# Patient Record
Sex: Female | Born: 1955 | Race: Black or African American | Hispanic: No | Marital: Married | Smoking: Current some day smoker
Health system: Southern US, Community
[De-identification: ages and names within clinical notes are randomized; demographics above are authoritative.]

## PROBLEM LIST (undated history)

## (undated) DIAGNOSIS — E7800 Pure hypercholesterolemia, unspecified: Secondary | ICD-10-CM

## (undated) DIAGNOSIS — L0390 Cellulitis, unspecified: Secondary | ICD-10-CM

## (undated) DIAGNOSIS — M5430 Sciatica, unspecified side: Secondary | ICD-10-CM

## (undated) DIAGNOSIS — I10 Essential (primary) hypertension: Secondary | ICD-10-CM

## (undated) HISTORY — PX: OTHER SURGICAL HISTORY: SHX169

---

## 2012-06-07 ENCOUNTER — Ambulatory Visit: Payer: Self-pay | Admitting: Gastroenterology

## 2012-06-11 LAB — PATHOLOGY REPORT

## 2013-06-30 ENCOUNTER — Ambulatory Visit: Payer: Self-pay | Admitting: Vascular Surgery

## 2013-06-30 LAB — BASIC METABOLIC PANEL
Calcium, Total: 9.5 mg/dL (ref 8.5–10.1)
Chloride: 109 mmol/L — ABNORMAL HIGH (ref 98–107)
Co2: 28 mmol/L (ref 21–32)
Osmolality: 280 (ref 275–301)
Potassium: 4.4 mmol/L (ref 3.5–5.1)
Sodium: 138 mmol/L (ref 136–145)

## 2013-07-29 ENCOUNTER — Ambulatory Visit: Payer: Self-pay | Admitting: Vascular Surgery

## 2013-07-29 DIAGNOSIS — I10 Essential (primary) hypertension: Secondary | ICD-10-CM

## 2013-07-29 LAB — BASIC METABOLIC PANEL
Anion Gap: 2 — ABNORMAL LOW (ref 7–16)
Calcium, Total: 10.3 mg/dL — ABNORMAL HIGH (ref 8.5–10.1)
Creatinine: 1.2 mg/dL (ref 0.60–1.30)
EGFR (Non-African Amer.): 50 — ABNORMAL LOW
Glucose: 94 mg/dL (ref 65–99)
Osmolality: 273 (ref 275–301)
Potassium: 4.2 mmol/L (ref 3.5–5.1)

## 2013-07-29 LAB — CBC
HCT: 39.1 % (ref 35.0–47.0)
MCH: 28.7 pg (ref 26.0–34.0)
MCHC: 32.9 g/dL (ref 32.0–36.0)
Platelet: 327 10*3/uL (ref 150–440)
RBC: 4.48 10*6/uL (ref 3.80–5.20)
RDW: 16.3 % — ABNORMAL HIGH (ref 11.5–14.5)
WBC: 8.1 10*3/uL (ref 3.6–11.0)

## 2013-08-06 ENCOUNTER — Ambulatory Visit: Payer: Self-pay | Admitting: Vascular Surgery

## 2013-08-08 LAB — PATHOLOGY REPORT

## 2013-08-17 ENCOUNTER — Inpatient Hospital Stay: Payer: Self-pay | Admitting: Internal Medicine

## 2013-08-17 LAB — CBC
HCT: 34.4 % — AB (ref 35.0–47.0)
HGB: 11.6 g/dL — AB (ref 12.0–16.0)
MCH: 29 pg (ref 26.0–34.0)
MCHC: 33.8 g/dL (ref 32.0–36.0)
MCV: 86 fL (ref 80–100)
PLATELETS: 476 10*3/uL — AB (ref 150–440)
RBC: 4.01 10*6/uL (ref 3.80–5.20)
RDW: 15.2 % — AB (ref 11.5–14.5)
WBC: 10.7 10*3/uL (ref 3.6–11.0)

## 2013-08-17 LAB — COMPREHENSIVE METABOLIC PANEL
ALBUMIN: 3 g/dL — AB (ref 3.4–5.0)
ALK PHOS: 124 U/L — AB
ALT: 22 U/L (ref 12–78)
ANION GAP: 4 — AB (ref 7–16)
AST: 24 U/L (ref 15–37)
BUN: 11 mg/dL (ref 7–18)
Bilirubin,Total: 0.3 mg/dL (ref 0.2–1.0)
CALCIUM: 10 mg/dL (ref 8.5–10.1)
CREATININE: 1.16 mg/dL (ref 0.60–1.30)
Chloride: 101 mmol/L (ref 98–107)
Co2: 29 mmol/L (ref 21–32)
EGFR (African American): >60
EGFR (Non-African Amer.): 53 — ABNORMAL LOW
Glucose: 100 mg/dL — ABNORMAL HIGH (ref 65–99)
Osmolality: 268 (ref 275–301)
POTASSIUM: 3.8 mmol/L (ref 3.5–5.1)
SODIUM: 134 mmol/L — AB (ref 136–145)
Total Protein: 7.9 g/dL (ref 6.4–8.2)

## 2013-08-18 LAB — CBC WITH DIFFERENTIAL/PLATELET
BASOS ABS: 0 10*3/uL (ref 0.0–0.1)
BASOS PCT: 0.4 %
EOS ABS: 0.2 10*3/uL (ref 0.0–0.7)
Eosinophil %: 1.7 %
HCT: 33.6 % — AB (ref 35.0–47.0)
HGB: 11.3 g/dL — AB (ref 12.0–16.0)
LYMPHS ABS: 3 10*3/uL (ref 1.0–3.6)
LYMPHS PCT: 29.8 %
MCH: 29.2 pg (ref 26.0–34.0)
MCHC: 33.8 g/dL (ref 32.0–36.0)
MCV: 87 fL (ref 80–100)
MONOS PCT: 5.8 %
Monocyte #: 0.6 x10 3/mm (ref 0.2–0.9)
NEUTROS PCT: 62.3 %
Neutrophil #: 6.3 10*3/uL (ref 1.4–6.5)
Platelet: 475 10*3/uL — ABNORMAL HIGH (ref 150–440)
RBC: 3.88 10*6/uL (ref 3.80–5.20)
RDW: 15.5 % — AB (ref 11.5–14.5)
WBC: 10.2 10*3/uL (ref 3.6–11.0)

## 2013-08-18 LAB — BASIC METABOLIC PANEL
ANION GAP: 5 — AB (ref 7–16)
BUN: 9 mg/dL (ref 7–18)
CHLORIDE: 101 mmol/L (ref 98–107)
Calcium, Total: 9.6 mg/dL (ref 8.5–10.1)
Co2: 29 mmol/L (ref 21–32)
Creatinine: 1.19 mg/dL (ref 0.60–1.30)
GFR CALC AF AMER: 59 — AB
GFR CALC NON AF AMER: 51 — AB
Glucose: 108 mg/dL — ABNORMAL HIGH (ref 65–99)
Osmolality: 269 (ref 275–301)
POTASSIUM: 3.5 mmol/L (ref 3.5–5.1)
Sodium: 135 mmol/L — ABNORMAL LOW (ref 136–145)

## 2013-08-18 LAB — MAGNESIUM
MAGNESIUM: 1.7 mg/dL — AB
Magnesium: 1.4 mg/dL — ABNORMAL LOW

## 2013-08-20 LAB — CBC WITH DIFFERENTIAL/PLATELET
BASOS PCT: 0.4 %
Basophil #: 0 10*3/uL (ref 0.0–0.1)
EOS ABS: 0.2 10*3/uL (ref 0.0–0.7)
Eosinophil %: 1.4 %
HCT: 33.7 % — ABNORMAL LOW (ref 35.0–47.0)
HGB: 11 g/dL — ABNORMAL LOW (ref 12.0–16.0)
Lymphocyte #: 2.8 10*3/uL (ref 1.0–3.6)
Lymphocyte %: 23.3 %
MCH: 28.2 pg (ref 26.0–34.0)
MCHC: 32.6 g/dL (ref 32.0–36.0)
MCV: 86 fL (ref 80–100)
Monocyte #: 0.5 x10 3/mm (ref 0.2–0.9)
Monocyte %: 4.1 %
NEUTROS ABS: 8.4 10*3/uL — AB (ref 1.4–6.5)
NEUTROS PCT: 70.8 %
Platelet: 488 10*3/uL — ABNORMAL HIGH (ref 150–440)
RBC: 3.91 10*6/uL (ref 3.80–5.20)
RDW: 15 % — ABNORMAL HIGH (ref 11.5–14.5)
WBC: 11.9 10*3/uL — AB (ref 3.6–11.0)

## 2013-08-20 LAB — VANCOMYCIN, TROUGH: Vancomycin, Trough: 15 ug/mL (ref 10–20)

## 2013-08-21 LAB — CBC WITH DIFFERENTIAL/PLATELET
BASOS PCT: 0.6 %
Basophil #: 0.1 10*3/uL (ref 0.0–0.1)
EOS ABS: 0.1 10*3/uL (ref 0.0–0.7)
EOS PCT: 1.3 %
HCT: 34 % — ABNORMAL LOW (ref 35.0–47.0)
HGB: 11.1 g/dL — ABNORMAL LOW (ref 12.0–16.0)
LYMPHS ABS: 3 10*3/uL (ref 1.0–3.6)
Lymphocyte %: 29 %
MCH: 28.2 pg (ref 26.0–34.0)
MCHC: 32.6 g/dL (ref 32.0–36.0)
MCV: 86 fL (ref 80–100)
Monocyte #: 0.5 x10 3/mm (ref 0.2–0.9)
Monocyte %: 4.8 %
Neutrophil #: 6.7 10*3/uL — ABNORMAL HIGH (ref 1.4–6.5)
Neutrophil %: 64.3 %
Platelet: 474 10*3/uL — ABNORMAL HIGH (ref 150–440)
RBC: 3.93 10*6/uL (ref 3.80–5.20)
RDW: 15 % — ABNORMAL HIGH (ref 11.5–14.5)
WBC: 10.4 10*3/uL (ref 3.6–11.0)

## 2013-08-21 LAB — BASIC METABOLIC PANEL
ANION GAP: 7 (ref 7–16)
Anion Gap: 3 — ABNORMAL LOW (ref 7–16)
BUN: 25 mg/dL — AB (ref 7–18)
BUN: 11 mg/dL (ref 7–18)
CALCIUM: 9.1 mg/dL (ref 8.5–10.1)
CREATININE: 0.95 mg/dL (ref 0.60–1.30)
Calcium, Total: 9.8 mg/dL (ref 8.5–10.1)
Chloride: 99 mmol/L (ref 98–107)
Chloride: 106 mmol/L (ref 98–107)
Co2: 32 mmol/L (ref 21–32)
Co2: 27 mmol/L (ref 21–32)
Creatinine: 1.56 mg/dL — ABNORMAL HIGH (ref 0.60–1.30)
EGFR (African American): 43 — ABNORMAL LOW
EGFR (African American): >60
EGFR (Non-African Amer.): >60
GFR CALC NON AF AMER: 37 — AB
GLUCOSE: 88 mg/dL (ref 65–99)
Glucose: 253 mg/dL — ABNORMAL HIGH (ref 65–99)
Osmolality: 272 (ref 275–301)
Osmolality: 287 (ref 275–301)
POTASSIUM: 4.1 mmol/L (ref 3.5–5.1)
POTASSIUM: 4.5 mmol/L (ref 3.5–5.1)
SODIUM: 140 mmol/L (ref 136–145)
Sodium: 134 mmol/L — ABNORMAL LOW (ref 136–145)

## 2013-08-22 LAB — CBC WITH DIFFERENTIAL/PLATELET
Basophil #: 0.1 x10 3/mm 3
Basophil %: 0.6 %
Eosinophil #: 0.2 x10 3/mm 3
Eosinophil %: 2.3 %
HCT: 33.7 % — ABNORMAL LOW
HGB: 11 g/dL — ABNORMAL LOW
Lymphocyte %: 27.5 %
Lymphs Abs: 2.5 x10 3/mm 3
MCH: 28.3 pg
MCHC: 32.8 g/dL
MCV: 86 fL
Monocyte #: 0.5 "x10 3/mm "
Monocyte %: 5.5 %
Neutrophil #: 5.8 x10 3/mm 3
Neutrophil %: 64.1 %
Platelet: 513 x10 3/mm 3 — ABNORMAL HIGH
RBC: 3.89 X10 6/mm 3
RDW: 15.2 % — ABNORMAL HIGH
WBC: 9.1 x10 3/mm 3

## 2013-08-22 LAB — BASIC METABOLIC PANEL
ANION GAP: 3 — AB (ref 7–16)
Anion Gap: 2 — ABNORMAL LOW (ref 7–16)
BUN: 25 mg/dL — ABNORMAL HIGH (ref 7–18)
BUN: 28 mg/dL — ABNORMAL HIGH (ref 7–18)
CALCIUM: 9.7 mg/dL (ref 8.5–10.1)
CHLORIDE: 102 mmol/L (ref 98–107)
CO2: 32 mmol/L (ref 21–32)
CREATININE: 1.75 mg/dL — AB (ref 0.60–1.30)
Calcium, Total: 9.6 mg/dL (ref 8.5–10.1)
Chloride: 100 mmol/L (ref 98–107)
Co2: 30 mmol/L (ref 21–32)
Creatinine: 1.82 mg/dL — ABNORMAL HIGH (ref 0.60–1.30)
EGFR (African American): 35 — ABNORMAL LOW
EGFR (African American): 37 — ABNORMAL LOW
EGFR (Non-African Amer.): 31 — ABNORMAL LOW
GFR CALC NON AF AMER: 32 — AB
GLUCOSE: 90 mg/dL (ref 65–99)
Glucose: 104 mg/dL — ABNORMAL HIGH (ref 65–99)
OSMOLALITY: 272 (ref 275–301)
Osmolality: 276 (ref 275–301)
POTASSIUM: 4.1 mmol/L (ref 3.5–5.1)
Potassium: 3.7 mmol/L (ref 3.5–5.1)
SODIUM: 134 mmol/L — AB (ref 136–145)
Sodium: 135 mmol/L — ABNORMAL LOW (ref 136–145)

## 2013-08-22 LAB — CREATININE, SERUM
Creatinine: 1.75 mg/dL — ABNORMAL HIGH (ref 0.60–1.30)
GFR CALC AF AMER: 37 — AB
GFR CALC NON AF AMER: 32 — AB

## 2013-08-22 LAB — CULTURE, BLOOD (SINGLE)

## 2013-08-22 LAB — WOUND CULTURE

## 2013-08-23 LAB — BASIC METABOLIC PANEL
Anion Gap: 3 — ABNORMAL LOW (ref 7–16)
Anion Gap: 5 — ABNORMAL LOW (ref 7–16)
BUN: 20 mg/dL — ABNORMAL HIGH (ref 7–18)
BUN: 17 mg/dL (ref 7–18)
CO2: 27 mmol/L (ref 21–32)
CREATININE: 1.57 mg/dL — AB (ref 0.60–1.30)
Calcium, Total: 9.2 mg/dL (ref 8.5–10.1)
Calcium, Total: 9.1 mg/dL (ref 8.5–10.1)
Chloride: 106 mmol/L (ref 98–107)
Chloride: 108 mmol/L — ABNORMAL HIGH (ref 98–107)
Co2: 27 mmol/L (ref 21–32)
Creatinine: 1.51 mg/dL — ABNORMAL HIGH (ref 0.60–1.30)
EGFR (Non-African Amer.): 38 — ABNORMAL LOW
GFR CALC AF AMER: 42 — AB
GFR CALC AF AMER: 44 — AB
GFR CALC NON AF AMER: 36 — AB
Glucose: 89 mg/dL (ref 65–99)
Glucose: 107 mg/dL — ABNORMAL HIGH (ref 65–99)
OSMOLALITY: 274 (ref 275–301)
Osmolality: 281 (ref 275–301)
POTASSIUM: 3.7 mmol/L (ref 3.5–5.1)
Potassium: 3.7 mmol/L (ref 3.5–5.1)
Sodium: 136 mmol/L (ref 136–145)
Sodium: 140 mmol/L (ref 136–145)

## 2013-08-23 LAB — HEMOGLOBIN: HGB: 10.5 g/dL — AB (ref 12.0–16.0)

## 2013-08-23 LAB — VANCOMYCIN, TROUGH: VANCOMYCIN, TROUGH: 11 ug/mL (ref 10–20)

## 2013-08-24 LAB — WOUND CULTURE

## 2014-01-08 ENCOUNTER — Inpatient Hospital Stay: Payer: Self-pay | Admitting: Internal Medicine

## 2014-01-08 ENCOUNTER — Ambulatory Visit: Payer: Self-pay | Admitting: Neurology

## 2014-01-08 LAB — CBC WITH DIFFERENTIAL/PLATELET
Basophil #: 0.1 10*3/uL (ref 0.0–0.1)
Basophil %: 0.6 %
Eosinophil #: 0 10*3/uL (ref 0.0–0.7)
Eosinophil %: 0.1 %
HCT: 40.7 % (ref 35.0–47.0)
HGB: 13.2 g/dL (ref 12.0–16.0)
LYMPHS PCT: 9.8 %
Lymphocyte #: 1.2 10*3/uL (ref 1.0–3.6)
MCH: 27.9 pg (ref 26.0–34.0)
MCHC: 32.6 g/dL (ref 32.0–36.0)
MCV: 86 fL (ref 80–100)
MONOS PCT: 3.4 %
Monocyte #: 0.4 x10 3/mm (ref 0.2–0.9)
NEUTROS ABS: 10.8 10*3/uL — AB (ref 1.4–6.5)
NEUTROS PCT: 86.1 %
Platelet: 423 10*3/uL (ref 150–440)
RBC: 4.75 10*6/uL (ref 3.80–5.20)
RDW: 14.2 % (ref 11.5–14.5)
WBC: 12.5 10*3/uL — AB (ref 3.6–11.0)

## 2014-01-08 LAB — BASIC METABOLIC PANEL
Anion Gap: 8 (ref 7–16)
BUN: 14 mg/dL (ref 7–18)
CHLORIDE: 102 mmol/L (ref 98–107)
Calcium, Total: 9.9 mg/dL (ref 8.5–10.1)
Co2: 27 mmol/L (ref 21–32)
Creatinine: 1.08 mg/dL (ref 0.60–1.30)
EGFR (Non-African Amer.): 57 — ABNORMAL LOW
Glucose: 113 mg/dL — ABNORMAL HIGH (ref 65–99)
Osmolality: 275 (ref 275–301)
Potassium: 3.8 mmol/L (ref 3.5–5.1)
Sodium: 137 mmol/L (ref 136–145)

## 2014-01-08 LAB — BUN: BUN: 16 mg/dL (ref 7–18)

## 2014-01-08 LAB — CREATININE, SERUM
CREATININE: 1.22 mg/dL (ref 0.60–1.30)
EGFR (African American): 57 — ABNORMAL LOW
GFR CALC NON AF AMER: 49 — AB

## 2014-01-09 LAB — CBC WITH DIFFERENTIAL/PLATELET
Basophil #: 0 10*3/uL (ref 0.0–0.1)
Basophil %: 0.5 %
EOS PCT: 0.8 %
Eosinophil #: 0.1 10*3/uL (ref 0.0–0.7)
HCT: 37.2 % (ref 35.0–47.0)
HGB: 12 g/dL (ref 12.0–16.0)
LYMPHS PCT: 18.9 %
Lymphocyte #: 1.7 10*3/uL (ref 1.0–3.6)
MCH: 27.6 pg (ref 26.0–34.0)
MCHC: 32.3 g/dL (ref 32.0–36.0)
MCV: 85 fL (ref 80–100)
MONOS PCT: 4 %
Monocyte #: 0.4 x10 3/mm (ref 0.2–0.9)
NEUTROS PCT: 75.8 %
Neutrophil #: 6.9 10*3/uL — ABNORMAL HIGH (ref 1.4–6.5)
PLATELETS: 431 10*3/uL (ref 150–440)
RBC: 4.36 10*6/uL (ref 3.80–5.20)
RDW: 14.3 % (ref 11.5–14.5)
WBC: 9.1 10*3/uL (ref 3.6–11.0)

## 2014-01-09 LAB — COMPREHENSIVE METABOLIC PANEL
ALBUMIN: 3.5 g/dL (ref 3.4–5.0)
ALT: 10 U/L — AB (ref 12–78)
ANION GAP: 6 — AB (ref 7–16)
Alkaline Phosphatase: 154 U/L — ABNORMAL HIGH
BILIRUBIN TOTAL: 0.4 mg/dL (ref 0.2–1.0)
BUN: 16 mg/dL (ref 7–18)
CALCIUM: 9.5 mg/dL (ref 8.5–10.1)
CHLORIDE: 102 mmol/L (ref 98–107)
CO2: 28 mmol/L (ref 21–32)
Creatinine: 1.45 mg/dL — ABNORMAL HIGH (ref 0.60–1.30)
EGFR (African American): 47 — ABNORMAL LOW
GFR CALC NON AF AMER: 40 — AB
Glucose: 107 mg/dL — ABNORMAL HIGH (ref 65–99)
Osmolality: 274 (ref 275–301)
Potassium: 3.2 mmol/L — ABNORMAL LOW (ref 3.5–5.1)
SGOT(AST): 26 U/L (ref 15–37)
SODIUM: 136 mmol/L (ref 136–145)
TOTAL PROTEIN: 7.8 g/dL (ref 6.4–8.2)

## 2014-01-09 LAB — LIPID PANEL
Cholesterol: 143 mg/dL (ref 0–200)
HDL Cholesterol: 25 mg/dL — ABNORMAL LOW (ref 40–60)
LDL CHOLESTEROL, CALC: 92 mg/dL (ref 0–100)
Triglycerides: 131 mg/dL (ref 0–200)
VLDL Cholesterol, Calc: 26 mg/dL (ref 5–40)

## 2014-01-09 LAB — HEMOGLOBIN A1C: HEMOGLOBIN A1C: 6.4 % — AB (ref 4.2–6.3)

## 2014-02-03 DIAGNOSIS — J449 Chronic obstructive pulmonary disease, unspecified: Secondary | ICD-10-CM | POA: Insufficient documentation

## 2014-02-03 DIAGNOSIS — I739 Peripheral vascular disease, unspecified: Secondary | ICD-10-CM | POA: Insufficient documentation

## 2014-02-10 ENCOUNTER — Other Ambulatory Visit: Payer: Self-pay | Admitting: Podiatry

## 2014-02-14 LAB — WOUND CULTURE

## 2014-02-25 ENCOUNTER — Ambulatory Visit: Payer: Self-pay | Admitting: Vascular Surgery

## 2014-02-25 LAB — URINALYSIS, COMPLETE
Bacteria: NONE SEEN
Bilirubin,UR: NEGATIVE
Blood: NEGATIVE
Glucose,UR: NEGATIVE mg/dL (ref 0–75)
Ketone: NEGATIVE
LEUKOCYTE ESTERASE: NEGATIVE
Nitrite: NEGATIVE
Ph: 5 (ref 4.5–8.0)
Protein: 30
Specific Gravity: 1.028 (ref 1.003–1.030)

## 2014-02-25 LAB — CBC
HCT: 34.6 % — AB (ref 35.0–47.0)
HGB: 11.2 g/dL — AB (ref 12.0–16.0)
MCH: 27.3 pg (ref 26.0–34.0)
MCHC: 32.4 g/dL (ref 32.0–36.0)
MCV: 84 fL (ref 80–100)
Platelet: 591 10*3/uL — ABNORMAL HIGH (ref 150–440)
RBC: 4.1 10*6/uL (ref 3.80–5.20)
RDW: 15.4 % — AB (ref 11.5–14.5)
WBC: 11.1 10*3/uL — AB (ref 3.6–11.0)

## 2014-02-25 LAB — BASIC METABOLIC PANEL
Anion Gap: 6 — ABNORMAL LOW (ref 7–16)
BUN: 13 mg/dL (ref 7–18)
CALCIUM: 10.4 mg/dL — AB (ref 8.5–10.1)
CHLORIDE: 99 mmol/L (ref 98–107)
CO2: 28 mmol/L (ref 21–32)
Creatinine: 1.23 mg/dL (ref 0.60–1.30)
EGFR (African American): 57 — ABNORMAL LOW
EGFR (Non-African Amer.): 49 — ABNORMAL LOW
Glucose: 94 mg/dL (ref 65–99)
Osmolality: 266 (ref 275–301)
Potassium: 4 mmol/L (ref 3.5–5.1)
Sodium: 133 mmol/L — ABNORMAL LOW (ref 136–145)

## 2014-03-03 ENCOUNTER — Ambulatory Visit: Payer: Self-pay | Admitting: Vascular Surgery

## 2014-03-25 ENCOUNTER — Inpatient Hospital Stay: Payer: Self-pay | Admitting: Vascular Surgery

## 2014-03-25 LAB — CBC WITH DIFFERENTIAL/PLATELET
BASOS PCT: 0.3 %
Basophil #: 0.1 10*3/uL (ref 0.0–0.1)
Eosinophil #: 0 10*3/uL (ref 0.0–0.7)
Eosinophil %: 0 %
HCT: 33.5 % — AB (ref 35.0–47.0)
HGB: 10.4 g/dL — AB (ref 12.0–16.0)
LYMPHS PCT: 6.2 %
Lymphocyte #: 1.1 10*3/uL (ref 1.0–3.6)
MCH: 25.5 pg — ABNORMAL LOW (ref 26.0–34.0)
MCHC: 31 g/dL — ABNORMAL LOW (ref 32.0–36.0)
MCV: 82 fL (ref 80–100)
Monocyte #: 0.7 x10 3/mm (ref 0.2–0.9)
Monocyte %: 4 %
NEUTROS ABS: 15.8 10*3/uL — AB (ref 1.4–6.5)
Neutrophil %: 89.5 %
PLATELETS: 526 10*3/uL — AB (ref 150–440)
RBC: 4.07 10*6/uL (ref 3.80–5.20)
RDW: 16.2 % — AB (ref 11.5–14.5)
WBC: 17.6 10*3/uL — AB (ref 3.6–11.0)

## 2014-03-25 LAB — COMPREHENSIVE METABOLIC PANEL
ALBUMIN: 2.2 g/dL — AB (ref 3.4–5.0)
AST: 43 U/L — AB (ref 15–37)
Alkaline Phosphatase: 103 U/L
Anion Gap: 11 (ref 7–16)
BUN: 13 mg/dL (ref 7–18)
Bilirubin,Total: 0.5 mg/dL (ref 0.2–1.0)
CHLORIDE: 104 mmol/L (ref 98–107)
CREATININE: 1.22 mg/dL (ref 0.60–1.30)
Calcium, Total: 8.9 mg/dL (ref 8.5–10.1)
Co2: 23 mmol/L (ref 21–32)
EGFR (Non-African Amer.): 49 — ABNORMAL LOW
GFR CALC AF AMER: 57 — AB
GLUCOSE: 181 mg/dL — AB (ref 65–99)
OSMOLALITY: 280 (ref 275–301)
Potassium: 3.8 mmol/L (ref 3.5–5.1)
SGPT (ALT): 22 U/L
Sodium: 138 mmol/L (ref 136–145)
TOTAL PROTEIN: 6.5 g/dL (ref 6.4–8.2)

## 2014-03-26 LAB — CBC WITH DIFFERENTIAL/PLATELET
BASOS PCT: 0.2 %
Basophil #: 0 10*3/uL (ref 0.0–0.1)
EOS ABS: 0 10*3/uL (ref 0.0–0.7)
Eosinophil %: 0 %
HCT: 30.5 % — AB (ref 35.0–47.0)
HGB: 9.7 g/dL — ABNORMAL LOW (ref 12.0–16.0)
LYMPHS PCT: 10.2 %
Lymphocyte #: 1.2 10*3/uL (ref 1.0–3.6)
MCH: 26.2 pg (ref 26.0–34.0)
MCHC: 31.9 g/dL — AB (ref 32.0–36.0)
MCV: 82 fL (ref 80–100)
MONOS PCT: 5 %
Monocyte #: 0.6 x10 3/mm (ref 0.2–0.9)
Neutrophil #: 10.1 10*3/uL — ABNORMAL HIGH (ref 1.4–6.5)
Neutrophil %: 84.6 %
Platelet: 461 10*3/uL — ABNORMAL HIGH (ref 150–440)
RBC: 3.71 10*6/uL — ABNORMAL LOW (ref 3.80–5.20)
RDW: 16.1 % — AB (ref 11.5–14.5)
WBC: 12 10*3/uL — ABNORMAL HIGH (ref 3.6–11.0)

## 2014-03-26 LAB — COMPREHENSIVE METABOLIC PANEL
ANION GAP: 10 (ref 7–16)
Albumin: 2.1 g/dL — ABNORMAL LOW (ref 3.4–5.0)
Alkaline Phosphatase: 92 U/L
BILIRUBIN TOTAL: 0.3 mg/dL (ref 0.2–1.0)
BUN: 11 mg/dL (ref 7–18)
CALCIUM: 8.7 mg/dL (ref 8.5–10.1)
CREATININE: 1.25 mg/dL (ref 0.60–1.30)
Chloride: 104 mmol/L (ref 98–107)
Co2: 25 mmol/L (ref 21–32)
EGFR (African American): 56 — ABNORMAL LOW
EGFR (Non-African Amer.): 48 — ABNORMAL LOW
GLUCOSE: 134 mg/dL — AB (ref 65–99)
OSMOLALITY: 279 (ref 275–301)
POTASSIUM: 3.8 mmol/L (ref 3.5–5.1)
SGOT(AST): 32 U/L (ref 15–37)
SGPT (ALT): 16 U/L
SODIUM: 139 mmol/L (ref 136–145)
Total Protein: 5.9 g/dL — ABNORMAL LOW (ref 6.4–8.2)

## 2014-03-26 LAB — PROTIME-INR
INR: 1.1
Prothrombin Time: 14.4 secs (ref 11.5–14.7)

## 2014-03-26 LAB — MAGNESIUM: Magnesium: 1.5 mg/dL — ABNORMAL LOW

## 2014-03-26 LAB — PHOSPHORUS: PHOSPHORUS: 3 mg/dL (ref 2.5–4.9)

## 2014-03-26 LAB — APTT: ACTIVATED PTT: 28.9 s (ref 23.6–35.9)

## 2014-03-27 LAB — PATHOLOGY REPORT

## 2014-03-28 LAB — BASIC METABOLIC PANEL
Anion Gap: 9 (ref 7–16)
BUN: 13 mg/dL (ref 7–18)
CALCIUM: 8.7 mg/dL (ref 8.5–10.1)
CREATININE: 1.09 mg/dL (ref 0.60–1.30)
Chloride: 108 mmol/L — ABNORMAL HIGH (ref 98–107)
Co2: 24 mmol/L (ref 21–32)
GFR CALC NON AF AMER: 57 — AB
Glucose: 80 mg/dL (ref 65–99)
OSMOLALITY: 280 (ref 275–301)
Potassium: 3.6 mmol/L (ref 3.5–5.1)
Sodium: 141 mmol/L (ref 136–145)

## 2014-03-28 LAB — CBC WITH DIFFERENTIAL/PLATELET
BASOS ABS: 0 10*3/uL (ref 0.0–0.1)
Basophil %: 0.5 %
Eosinophil #: 0.1 10*3/uL (ref 0.0–0.7)
Eosinophil %: 1.2 %
HCT: 26.7 % — ABNORMAL LOW (ref 35.0–47.0)
HGB: 8.4 g/dL — ABNORMAL LOW (ref 12.0–16.0)
LYMPHS PCT: 17.5 %
Lymphocyte #: 1.8 10*3/uL (ref 1.0–3.6)
MCH: 25.8 pg — ABNORMAL LOW (ref 26.0–34.0)
MCHC: 31.4 g/dL — ABNORMAL LOW (ref 32.0–36.0)
MCV: 82 fL (ref 80–100)
Monocyte #: 0.5 x10 3/mm (ref 0.2–0.9)
Monocyte %: 5 %
Neutrophil #: 7.7 10*3/uL — ABNORMAL HIGH (ref 1.4–6.5)
Neutrophil %: 75.8 %
PLATELETS: 397 10*3/uL (ref 150–440)
RBC: 3.26 10*6/uL — ABNORMAL LOW (ref 3.80–5.20)
RDW: 16.1 % — ABNORMAL HIGH (ref 11.5–14.5)
WBC: 10.2 10*3/uL (ref 3.6–11.0)

## 2014-03-29 LAB — BASIC METABOLIC PANEL
Anion Gap: 10 (ref 7–16)
BUN: 13 mg/dL (ref 7–18)
CALCIUM: 8.9 mg/dL (ref 8.5–10.1)
CHLORIDE: 104 mmol/L (ref 98–107)
CO2: 24 mmol/L (ref 21–32)
Creatinine: 1.05 mg/dL (ref 0.60–1.30)
EGFR (African American): >60
GFR CALC NON AF AMER: 59 — AB
GLUCOSE: 68 mg/dL (ref 65–99)
Osmolality: 274 (ref 275–301)
Potassium: 3.4 mmol/L — ABNORMAL LOW (ref 3.5–5.1)
Sodium: 138 mmol/L (ref 136–145)

## 2014-03-30 LAB — BASIC METABOLIC PANEL
Anion Gap: 8 (ref 7–16)
BUN: 11 mg/dL (ref 7–18)
CALCIUM: 8.5 mg/dL (ref 8.5–10.1)
Chloride: 99 mmol/L (ref 98–107)
Co2: 30 mmol/L (ref 21–32)
Creatinine: 1.11 mg/dL (ref 0.60–1.30)
EGFR (African American): >60
EGFR (Non-African Amer.): 55 — ABNORMAL LOW
Glucose: 107 mg/dL — ABNORMAL HIGH (ref 65–99)
Osmolality: 274 (ref 275–301)
POTASSIUM: 3.2 mmol/L — AB (ref 3.5–5.1)
SODIUM: 137 mmol/L (ref 136–145)

## 2014-03-30 LAB — CBC WITH DIFFERENTIAL/PLATELET
BASOS PCT: 0.5 %
Basophil #: 0 10*3/uL (ref 0.0–0.1)
Eosinophil #: 0.2 10*3/uL (ref 0.0–0.7)
Eosinophil %: 2.4 %
HCT: 26.9 % — AB (ref 35.0–47.0)
HGB: 8.8 g/dL — ABNORMAL LOW (ref 12.0–16.0)
Lymphocyte #: 1.2 10*3/uL (ref 1.0–3.6)
Lymphocyte %: 12.9 %
MCH: 26 pg (ref 26.0–34.0)
MCHC: 32.8 g/dL (ref 32.0–36.0)
MCV: 79 fL — ABNORMAL LOW (ref 80–100)
MONOS PCT: 6.9 %
Monocyte #: 0.6 x10 3/mm (ref 0.2–0.9)
Neutrophil #: 7.2 10*3/uL — ABNORMAL HIGH (ref 1.4–6.5)
Neutrophil %: 77.3 %
PLATELETS: 480 10*3/uL — AB (ref 150–440)
RBC: 3.38 10*6/uL — ABNORMAL LOW (ref 3.80–5.20)
RDW: 15.9 % — ABNORMAL HIGH (ref 11.5–14.5)
WBC: 9.2 10*3/uL (ref 3.6–11.0)

## 2014-04-10 ENCOUNTER — Inpatient Hospital Stay: Payer: Self-pay | Admitting: Vascular Surgery

## 2014-04-10 LAB — CREATININE, SERUM
Creatinine: 1.4 mg/dL — ABNORMAL HIGH (ref 0.60–1.30)
EGFR (Non-African Amer.): 42 — ABNORMAL LOW
GFR CALC AF AMER: 49 — AB

## 2014-04-11 ENCOUNTER — Ambulatory Visit: Payer: Self-pay

## 2014-04-11 LAB — BASIC METABOLIC PANEL
Anion Gap: 7 (ref 7–16)
BUN: 12 mg/dL (ref 7–18)
CHLORIDE: 100 mmol/L (ref 98–107)
Calcium, Total: 9.2 mg/dL (ref 8.5–10.1)
Co2: 28 mmol/L (ref 21–32)
Creatinine: 1.33 mg/dL — ABNORMAL HIGH (ref 0.60–1.30)
EGFR (African American): 52 — ABNORMAL LOW
EGFR (Non-African Amer.): 45 — ABNORMAL LOW
GLUCOSE: 98 mg/dL (ref 65–99)
OSMOLALITY: 270 (ref 275–301)
Potassium: 4.3 mmol/L (ref 3.5–5.1)
SODIUM: 135 mmol/L — AB (ref 136–145)

## 2014-04-11 LAB — HEMATOCRIT: HCT: 27.4 % — AB (ref 35.0–47.0)

## 2014-04-11 LAB — HEMOGLOBIN: HGB: 8.7 g/dL — AB (ref 12.0–16.0)

## 2014-04-12 LAB — CBC WITH DIFFERENTIAL/PLATELET
BASOS ABS: 0.1 10*3/uL (ref 0.0–0.1)
Basophil %: 0.8 %
EOS ABS: 0.3 10*3/uL (ref 0.0–0.7)
EOS PCT: 3.7 %
HCT: 24.9 % — AB (ref 35.0–47.0)
HGB: 8 g/dL — ABNORMAL LOW (ref 12.0–16.0)
LYMPHS ABS: 2.1 10*3/uL (ref 1.0–3.6)
Lymphocyte %: 21.8 %
MCH: 25.7 pg — ABNORMAL LOW (ref 26.0–34.0)
MCHC: 32 g/dL (ref 32.0–36.0)
MCV: 80 fL (ref 80–100)
Monocyte #: 0.6 x10 3/mm (ref 0.2–0.9)
Monocyte %: 6.8 %
NEUTROS ABS: 6.3 10*3/uL (ref 1.4–6.5)
NEUTROS PCT: 66.9 %
PLATELETS: 819 10*3/uL — AB (ref 150–440)
RBC: 3.1 10*6/uL — AB (ref 3.80–5.20)
RDW: 17.5 % — ABNORMAL HIGH (ref 11.5–14.5)
WBC: 9.5 10*3/uL (ref 3.6–11.0)

## 2014-04-12 LAB — BASIC METABOLIC PANEL
Anion Gap: 7 (ref 7–16)
BUN: 9 mg/dL (ref 7–18)
CHLORIDE: 105 mmol/L (ref 98–107)
CO2: 26 mmol/L (ref 21–32)
CREATININE: 1.2 mg/dL (ref 0.60–1.30)
Calcium, Total: 8.6 mg/dL (ref 8.5–10.1)
EGFR (African American): 59 — ABNORMAL LOW
GFR CALC NON AF AMER: 50 — AB
GLUCOSE: 89 mg/dL (ref 65–99)
Osmolality: 274 (ref 275–301)
Potassium: 4.1 mmol/L (ref 3.5–5.1)
Sodium: 138 mmol/L (ref 136–145)

## 2014-04-13 LAB — CBC WITH DIFFERENTIAL/PLATELET
BASOS PCT: 1.1 %
Basophil #: 0.1 10*3/uL (ref 0.0–0.1)
EOS ABS: 0.3 10*3/uL (ref 0.0–0.7)
Eosinophil %: 3.1 %
HCT: 25.3 % — ABNORMAL LOW (ref 35.0–47.0)
HGB: 7.9 g/dL — AB (ref 12.0–16.0)
Lymphocyte #: 2.1 10*3/uL (ref 1.0–3.6)
Lymphocyte %: 21.7 %
MCH: 25.5 pg — ABNORMAL LOW (ref 26.0–34.0)
MCHC: 31.2 g/dL — ABNORMAL LOW (ref 32.0–36.0)
MCV: 82 fL (ref 80–100)
MONOS PCT: 6.7 %
Monocyte #: 0.7 x10 3/mm (ref 0.2–0.9)
Neutrophil #: 6.6 10*3/uL — ABNORMAL HIGH (ref 1.4–6.5)
Neutrophil %: 67.4 %
Platelet: 795 10*3/uL — ABNORMAL HIGH (ref 150–440)
RBC: 3.1 10*6/uL — ABNORMAL LOW (ref 3.80–5.20)
RDW: 17.8 % — AB (ref 11.5–14.5)
WBC: 9.8 10*3/uL (ref 3.6–11.0)

## 2014-04-13 LAB — BASIC METABOLIC PANEL
ANION GAP: 9 (ref 7–16)
BUN: 7 mg/dL (ref 7–18)
CREATININE: 1.29 mg/dL (ref 0.60–1.30)
Calcium, Total: 8.4 mg/dL — ABNORMAL LOW (ref 8.5–10.1)
Chloride: 104 mmol/L (ref 98–107)
Co2: 25 mmol/L (ref 21–32)
EGFR (African American): 54 — ABNORMAL LOW
EGFR (Non-African Amer.): 46 — ABNORMAL LOW
Glucose: 100 mg/dL — ABNORMAL HIGH (ref 65–99)
OSMOLALITY: 274 (ref 275–301)
Potassium: 4 mmol/L (ref 3.5–5.1)
SODIUM: 138 mmol/L (ref 136–145)

## 2014-04-14 LAB — CBC WITH DIFFERENTIAL/PLATELET
BASOS ABS: 0.1 10*3/uL (ref 0.0–0.1)
BASOS PCT: 1 %
EOS ABS: 0.4 10*3/uL (ref 0.0–0.7)
EOS PCT: 4 %
HCT: 24.4 % — ABNORMAL LOW (ref 35.0–47.0)
HGB: 7.8 g/dL — ABNORMAL LOW (ref 12.0–16.0)
LYMPHS PCT: 25.1 %
Lymphocyte #: 2.3 10*3/uL (ref 1.0–3.6)
MCH: 25.9 pg — ABNORMAL LOW (ref 26.0–34.0)
MCHC: 31.8 g/dL — AB (ref 32.0–36.0)
MCV: 81 fL (ref 80–100)
MONO ABS: 0.7 x10 3/mm (ref 0.2–0.9)
Monocyte %: 7.9 %
NEUTROS ABS: 5.7 10*3/uL (ref 1.4–6.5)
NEUTROS PCT: 62 %
PLATELETS: 751 10*3/uL — AB (ref 150–440)
RBC: 3 10*6/uL — ABNORMAL LOW (ref 3.80–5.20)
RDW: 18 % — AB (ref 11.5–14.5)
WBC: 9.3 10*3/uL (ref 3.6–11.0)

## 2014-04-14 LAB — VANCOMYCIN, TROUGH: Vancomycin, Trough: 16 ug/mL (ref 10–20)

## 2014-04-17 LAB — WOUND CULTURE

## 2014-05-07 ENCOUNTER — Ambulatory Visit: Payer: Self-pay | Admitting: Vascular Surgery

## 2014-05-07 LAB — BASIC METABOLIC PANEL
Anion Gap: 5 — ABNORMAL LOW (ref 7–16)
BUN: 16 mg/dL (ref 7–18)
CALCIUM: 9.1 mg/dL (ref 8.5–10.1)
CREATININE: 0.93 mg/dL (ref 0.60–1.30)
Chloride: 102 mmol/L (ref 98–107)
Co2: 29 mmol/L (ref 21–32)
GLUCOSE: 92 mg/dL (ref 65–99)
Osmolality: 273 (ref 275–301)
Potassium: 3.9 mmol/L (ref 3.5–5.1)
Sodium: 136 mmol/L (ref 136–145)

## 2014-05-07 LAB — CBC
HCT: 27.8 % — AB (ref 35.0–47.0)
HGB: 8.9 g/dL — ABNORMAL LOW (ref 12.0–16.0)
MCH: 26.3 pg (ref 26.0–34.0)
MCHC: 31.9 g/dL — ABNORMAL LOW (ref 32.0–36.0)
MCV: 83 fL (ref 80–100)
PLATELETS: 554 10*3/uL — AB (ref 150–440)
RBC: 3.37 10*6/uL — AB (ref 3.80–5.20)
RDW: 19.9 % — ABNORMAL HIGH (ref 11.5–14.5)
WBC: 9.2 10*3/uL (ref 3.6–11.0)

## 2014-05-07 LAB — APTT: ACTIVATED PTT: 33.1 s (ref 23.6–35.9)

## 2014-05-07 LAB — PROTIME-INR
INR: 1
Prothrombin Time: 13.2 secs (ref 11.5–14.7)

## 2014-05-08 LAB — CBC WITH DIFFERENTIAL/PLATELET
BASOS PCT: 0.5 %
Basophil #: 0.1 10*3/uL (ref 0.0–0.1)
Eosinophil #: 0.3 10*3/uL (ref 0.0–0.7)
Eosinophil %: 3 %
HCT: 26.2 % — ABNORMAL LOW (ref 35.0–47.0)
HGB: 8.1 g/dL — ABNORMAL LOW (ref 12.0–16.0)
LYMPHS PCT: 18 %
Lymphocyte #: 1.9 10*3/uL (ref 1.0–3.6)
MCH: 25.9 pg — ABNORMAL LOW (ref 26.0–34.0)
MCHC: 30.8 g/dL — ABNORMAL LOW (ref 32.0–36.0)
MCV: 84 fL (ref 80–100)
MONO ABS: 0.7 x10 3/mm (ref 0.2–0.9)
Monocyte %: 6.6 %
Neutrophil #: 7.7 10*3/uL — ABNORMAL HIGH (ref 1.4–6.5)
Neutrophil %: 71.9 %
PLATELETS: 505 10*3/uL — AB (ref 150–440)
RBC: 3.12 10*6/uL — ABNORMAL LOW (ref 3.80–5.20)
RDW: 19.8 % — AB (ref 11.5–14.5)
WBC: 10.7 10*3/uL (ref 3.6–11.0)

## 2014-05-08 LAB — URINALYSIS, COMPLETE
Bacteria: NONE SEEN
Bilirubin,UR: NEGATIVE
Blood: NEGATIVE
Glucose,UR: NEGATIVE mg/dL (ref 0–75)
KETONE: NEGATIVE
Leukocyte Esterase: NEGATIVE
NITRITE: NEGATIVE
PROTEIN: NEGATIVE
Ph: 5 (ref 4.5–8.0)
RBC,UR: 3 /HPF (ref 0–5)
SPECIFIC GRAVITY: 1.02 (ref 1.003–1.030)
Squamous Epithelial: 3
WBC UR: <1 /HPF (ref 0–5)

## 2014-05-08 LAB — BASIC METABOLIC PANEL
Anion Gap: 6 — ABNORMAL LOW (ref 7–16)
BUN: 11 mg/dL (ref 7–18)
CO2: 27 mmol/L (ref 21–32)
Calcium, Total: 8.9 mg/dL (ref 8.5–10.1)
Chloride: 103 mmol/L (ref 98–107)
Creatinine: 0.96 mg/dL (ref 0.60–1.30)
EGFR (African American): >60
EGFR (Non-African Amer.): >60
Glucose: 96 mg/dL (ref 65–99)
Osmolality: 271 (ref 275–301)
Potassium: 4.3 mmol/L (ref 3.5–5.1)
Sodium: 136 mmol/L (ref 136–145)

## 2014-05-11 LAB — WOUND CULTURE

## 2014-05-13 LAB — PATHOLOGY REPORT

## 2014-05-20 ENCOUNTER — Inpatient Hospital Stay: Payer: Self-pay | Admitting: Vascular Surgery

## 2014-05-20 LAB — BASIC METABOLIC PANEL
Anion Gap: 8 (ref 7–16)
BUN: 14 mg/dL (ref 7–18)
CALCIUM: 8.6 mg/dL (ref 8.5–10.1)
CHLORIDE: 103 mmol/L (ref 98–107)
Co2: 25 mmol/L (ref 21–32)
Creatinine: 1 mg/dL (ref 0.60–1.30)
EGFR (African American): >60
EGFR (Non-African Amer.): >60
Glucose: 104 mg/dL — ABNORMAL HIGH (ref 65–99)
Osmolality: 273 (ref 275–301)
Potassium: 3.9 mmol/L (ref 3.5–5.1)
Sodium: 136 mmol/L (ref 136–145)

## 2014-05-20 LAB — CBC WITH DIFFERENTIAL/PLATELET
Basophil #: 0.1 10*3/uL (ref 0.0–0.1)
Basophil %: 0.6 %
EOS ABS: 0.1 10*3/uL (ref 0.0–0.7)
EOS PCT: 1 %
HCT: 24.2 % — AB (ref 35.0–47.0)
HGB: 7.3 g/dL — ABNORMAL LOW (ref 12.0–16.0)
Lymphocyte #: 2.6 10*3/uL (ref 1.0–3.6)
Lymphocyte %: 18.7 %
MCH: 24.8 pg — ABNORMAL LOW (ref 26.0–34.0)
MCHC: 30.3 g/dL — ABNORMAL LOW (ref 32.0–36.0)
MCV: 82 fL (ref 80–100)
Monocyte #: 0.7 x10 3/mm (ref 0.2–0.9)
Monocyte %: 4.7 %
NEUTROS PCT: 75 %
Neutrophil #: 10.6 10*3/uL — ABNORMAL HIGH (ref 1.4–6.5)
Platelet: 779 10*3/uL — ABNORMAL HIGH (ref 150–440)
RBC: 2.96 10*6/uL — ABNORMAL LOW (ref 3.80–5.20)
RDW: 18.2 % — ABNORMAL HIGH (ref 11.5–14.5)
WBC: 14.1 10*3/uL — ABNORMAL HIGH (ref 3.6–11.0)

## 2014-05-20 LAB — CREATININE, SERUM
Creatinine: 0.92 mg/dL (ref 0.60–1.30)
EGFR (Non-African Amer.): >60

## 2014-05-20 LAB — PROTIME-INR
INR: 1.1
Prothrombin Time: 13.9 secs (ref 11.5–14.7)

## 2014-05-22 LAB — CBC WITH DIFFERENTIAL/PLATELET
BASOS PCT: 0.6 %
Basophil #: 0.1 10*3/uL (ref 0.0–0.1)
EOS ABS: 0.2 10*3/uL (ref 0.0–0.7)
EOS PCT: 2.3 %
HCT: 25.5 % — AB (ref 35.0–47.0)
HGB: 8.2 g/dL — ABNORMAL LOW (ref 12.0–16.0)
LYMPHS ABS: 2 10*3/uL (ref 1.0–3.6)
Lymphocyte %: 18.2 %
MCH: 26.2 pg (ref 26.0–34.0)
MCHC: 32.2 g/dL (ref 32.0–36.0)
MCV: 81 fL (ref 80–100)
MONO ABS: 0.6 x10 3/mm (ref 0.2–0.9)
Monocyte %: 5.5 %
NEUTROS ABS: 8 10*3/uL — AB (ref 1.4–6.5)
NEUTROS PCT: 73.4 %
PLATELETS: 927 10*3/uL — AB (ref 150–440)
RBC: 3.13 10*6/uL — ABNORMAL LOW (ref 3.80–5.20)
RDW: 18.2 % — ABNORMAL HIGH (ref 11.5–14.5)
WBC: 10.9 10*3/uL (ref 3.6–11.0)

## 2014-05-22 LAB — HEMOGLOBIN: HGB: 6.9 g/dL — AB (ref 12.0–16.0)

## 2014-05-22 LAB — TROPONIN I: Troponin-I: 0.04 ng/mL

## 2014-05-22 LAB — CK TOTAL AND CKMB (NOT AT ARMC)
CK, Total: 167 U/L
CK-MB: 2.2 ng/mL (ref 0.5–3.6)

## 2014-05-23 LAB — BASIC METABOLIC PANEL
ANION GAP: 9 (ref 7–16)
BUN: 6 mg/dL — ABNORMAL LOW (ref 7–18)
CHLORIDE: 105 mmol/L (ref 98–107)
Calcium, Total: 7.8 mg/dL — ABNORMAL LOW (ref 8.5–10.1)
Co2: 24 mmol/L (ref 21–32)
Creatinine: 0.78 mg/dL (ref 0.60–1.30)
EGFR (African American): >60
EGFR (Non-African Amer.): >60
GLUCOSE: 84 mg/dL (ref 65–99)
Osmolality: 272 (ref 275–301)
POTASSIUM: 3.5 mmol/L (ref 3.5–5.1)
SODIUM: 138 mmol/L (ref 136–145)

## 2014-05-23 LAB — CBC WITH DIFFERENTIAL/PLATELET
BASOS ABS: 0 10*3/uL (ref 0.0–0.1)
Basophil %: 0.4 %
Eosinophil #: 0.1 10*3/uL (ref 0.0–0.7)
Eosinophil %: 0.5 %
HCT: 20.1 % — AB (ref 35.0–47.0)
HGB: 6.3 g/dL — AB (ref 12.0–16.0)
Lymphocyte #: 1.6 10*3/uL (ref 1.0–3.6)
Lymphocyte %: 12.7 %
MCH: 25.5 pg — AB (ref 26.0–34.0)
MCHC: 31.4 g/dL — AB (ref 32.0–36.0)
MCV: 81 fL (ref 80–100)
MONO ABS: 0.8 x10 3/mm (ref 0.2–0.9)
MONOS PCT: 6 %
NEUTROS ABS: 10.1 10*3/uL — AB (ref 1.4–6.5)
Neutrophil %: 80.4 %
PLATELETS: 801 10*3/uL — AB (ref 150–440)
RBC: 2.47 10*6/uL — AB (ref 3.80–5.20)
RDW: 18.6 % — ABNORMAL HIGH (ref 11.5–14.5)
WBC: 12.6 10*3/uL — ABNORMAL HIGH (ref 3.6–11.0)

## 2014-05-23 LAB — CREATININE, SERUM
CREATININE: 0.81 mg/dL (ref 0.60–1.30)
EGFR (Non-African Amer.): >60

## 2014-05-23 LAB — VANCOMYCIN, TROUGH: VANCOMYCIN, TROUGH: 6 ug/mL — AB (ref 10–20)

## 2014-05-24 LAB — CBC WITH DIFFERENTIAL/PLATELET
Basophil #: 0.2 10*3/uL — ABNORMAL HIGH (ref 0.0–0.1)
Basophil %: 1 %
EOS PCT: 1.3 %
Eosinophil #: 0.2 10*3/uL (ref 0.0–0.7)
HCT: 24.1 % — ABNORMAL LOW (ref 35.0–47.0)
HGB: 7.4 g/dL — ABNORMAL LOW (ref 12.0–16.0)
Lymphocyte #: 2.4 10*3/uL (ref 1.0–3.6)
Lymphocyte %: 15.5 %
MCH: 24.2 pg — ABNORMAL LOW (ref 26.0–34.0)
MCHC: 30.5 g/dL — ABNORMAL LOW (ref 32.0–36.0)
MCV: 80 fL (ref 80–100)
MONO ABS: 1 x10 3/mm — AB (ref 0.2–0.9)
MONOS PCT: 6.1 %
Neutrophil #: 12 10*3/uL — ABNORMAL HIGH (ref 1.4–6.5)
Neutrophil %: 76.1 %
Platelet: 777 10*3/uL — ABNORMAL HIGH (ref 150–440)
RBC: 3.03 10*6/uL — AB (ref 3.80–5.20)
RDW: 18.5 % — ABNORMAL HIGH (ref 11.5–14.5)
WBC: 15.7 10*3/uL — ABNORMAL HIGH (ref 3.6–11.0)

## 2014-05-25 LAB — CBC WITH DIFFERENTIAL/PLATELET
BASOS ABS: 0.1 10*3/uL (ref 0.0–0.1)
Basophil %: 0.8 %
EOS PCT: 1.1 %
Eosinophil #: 0.1 10*3/uL (ref 0.0–0.7)
HCT: 23.8 % — ABNORMAL LOW (ref 35.0–47.0)
HGB: 7.5 g/dL — AB (ref 12.0–16.0)
LYMPHS PCT: 22.8 %
Lymphocyte #: 2.9 10*3/uL (ref 1.0–3.6)
MCH: 24.9 pg — ABNORMAL LOW (ref 26.0–34.0)
MCHC: 31.4 g/dL — ABNORMAL LOW (ref 32.0–36.0)
MCV: 79 fL — ABNORMAL LOW (ref 80–100)
Monocyte #: 0.9 x10 3/mm (ref 0.2–0.9)
Monocyte %: 7.2 %
Neutrophil #: 8.7 10*3/uL — ABNORMAL HIGH (ref 1.4–6.5)
Neutrophil %: 68.1 %
PLATELETS: 769 10*3/uL — AB (ref 150–440)
RBC: 3.01 10*6/uL — ABNORMAL LOW (ref 3.80–5.20)
RDW: 18.3 % — ABNORMAL HIGH (ref 11.5–14.5)
WBC: 12.7 10*3/uL — ABNORMAL HIGH (ref 3.6–11.0)

## 2014-05-27 LAB — CREATININE, SERUM
Creatinine: 0.86 mg/dL (ref 0.60–1.30)
EGFR (African American): >60
EGFR (Non-African Amer.): >60

## 2014-05-27 LAB — PATHOLOGY REPORT

## 2014-06-09 LAB — WOUND CULTURE

## 2014-07-21 DIAGNOSIS — Z1624 Resistance to multiple antibiotics: Secondary | ICD-10-CM | POA: Insufficient documentation

## 2014-11-12 ENCOUNTER — Encounter: Payer: Self-pay | Admitting: Physical Therapy

## 2014-11-12 ENCOUNTER — Ambulatory Visit: Payer: Medicare HMO | Attending: Vascular Surgery | Admitting: Physical Therapy

## 2014-11-12 DIAGNOSIS — R269 Unspecified abnormalities of gait and mobility: Secondary | ICD-10-CM

## 2014-11-12 DIAGNOSIS — R6889 Other general symptoms and signs: Secondary | ICD-10-CM

## 2014-11-12 DIAGNOSIS — Z89612 Acquired absence of left leg above knee: Secondary | ICD-10-CM | POA: Diagnosis not present

## 2014-11-12 DIAGNOSIS — R29818 Other symptoms and signs involving the nervous system: Secondary | ICD-10-CM | POA: Insufficient documentation

## 2014-11-12 DIAGNOSIS — R2689 Other abnormalities of gait and mobility: Secondary | ICD-10-CM

## 2014-11-13 NOTE — Therapy (Signed)
[facility] [address] [place], [place], [address] Phone: [phone]   Fax:  [phone]  Physical Therapy Evaluation  Patient Details  Name: Natalie [name] MRN: [id number] Date of Birth: 27-Nov-1955 Referring Provider:  Algernon Huxley, MD  Encounter Date: 11/12/2014      PT End of Session - 11/12/14 1445    Visit Number 1   Number of Visits 17   Date for PT Re-Evaluation 01/11/15   PT Start Time [id number]   PT Stop Time 1530   PT Time Calculation (min) 45 min   Equipment Utilized During Treatment Gait belt   Activity Tolerance Patient tolerated treatment well   Behavior During Therapy [facility] for tasks assessed/performed      History reviewed. No pertinent past medical history.  History reviewed. No pertinent past surgical history.  There were no vitals filed for this visit.  Visit Diagnosis:  Abnormality of gait  Balance problems  Decreased functional activity tolerance  Status post above knee amputation of left lower extremity      Subjective Assessment - 11/12/14 1456    Subjective [name] underwent a left Transfemoral Amputation on 05-22-14 due to cellulitis. She recieved her first prosthesis on 10/28/14 and is dependent in use & care. She presents for PT evaluation.   Patient Stated Goals To be able to walk in home and community with prosthesis.   Currently in Pain? No/denies            [facility] PT Assessment - 11/12/14 1445    Assessment   Medical Diagnosis Left Transfemoral Amputation Prosthesis   Onset Date 10/28/14   Precautions   Precautions Fall   Restrictions   Weight Bearing Restrictions No   Balance Screen   Has the patient fallen in the past 6 months Yes   How many times? 1  hurry too much   Has the patient had a decrease in activity level because of a fear of falling?  Yes   Is the patient reluctant to leave their home because of a fear of falling?  No  uses [place] Private residence   Living Arrangements Alone   Type of [place] to enter   Entrance Stairs-Number of Steps 4   Entrance Stairs-Rails Right;Left  cannot reach both   [place] One level  first floor apt, laundry room is across [place] - 2 wheels;Cane - single point;Crutches;Bedside commode;Shower seat;Wheelchair - manual   Prior Function   Level of Independence Independent with basic ADLs;Independent with homemaking with ambulation;Independent with gait;Independent with transfers  limited community without device   AROM   Overall AROM  Within functional limits for tasks performed   Strength   Overall Strength Within functional limits for tasks performed   Transfers   Sit to Stand 5: Supervision;With upper extremity assist;With armrests;From chair/3-in-1  to walker for stabilization   Sit to Stand Details (indicate cue type and reason) verbal & demo cues on prosthetic knee control   Stand to Sit 5: Supervision;With upper extremity assist;With armrests;To chair/3-in-1  from walker for stabilization   Stand to Sit Details verbal & demo cues on prosthetic knee control   Ambulation/Gait   Ambulation/Gait Yes   Ambulation/Gait Assistance 4: Min assist   Ambulation/Gait Assistance Details manual & verbal cues on prosthetic knee control    Ambulation Distance (Feet) 20 Feet   Assistive  device Prosthesis;Rolling walker   Gait Pattern Step-to pattern;Decreased step length - right;Decreased stance time - left;Decreased stride length;Decreased hip/knee flexion - left;Decreased weight shift to left;Left hip hike;Left flexed knee in stance;Lateral hip instability;Decreased trunk rotation;Trunk rotated posteriorly on left;Trunk flexed;Abducted - left;Poor foot clearance - left   Ambulation Surface Indoor;Level   Berg Balance Test   Sit to Stand Needs minimal aid to stand or to stabilize  walker or minA to  stabilize   Standing Unsupported Needs several tries to stand 30 seconds unsupported  2 min with RW with supervision   Sitting with Back Unsupported but Feet Supported on Floor or Stool Able to sit safely and securely 2 minutes   Stand to Sit Sits independently, has uncontrolled descent  requires walker to stabilize   Transfers Needs one person to assist  with rolling walker & prosthesis   Standing Unsupported with Eyes Closed Needs help to keep from falling  stands 10 sec with supervision with walker   Standing Ubsupported with Feet Together Needs help to attain position and unable to hold for 15 seconds  able to stand 1 min with supervision with walker   From Standing, Reach Forward with Outstretched Arm Loses balance while trying/requires external support  reaches 4" with rolling walker with supervision   From Standing Position, Pick up Object from Floor Unable to try/needs assist to keep balance  reaches to knee level with walker support   From Standing Position, Turn to Look Behind Over each Shoulder Needs assist to keep from losing balance and falling  looks to sides with walker support   Turn 360 Degrees Needs assistance while turning  mod A with walker & prosthesis   Standing Unsupported, Alternately Place Feet on Step/Stool Needs assistance to keep from falling or unable to try  unable even with walker support   Standing Unsupported, One Foot in [employer] balance while stepping or standing  needs minA to step small step forward & hold 30sec with RW   Standing on One Leg Unable to try or needs assist to prevent fall  stands on right LE with walker support 7 sec with supervisio   Total Score 8         Prosthetics Assessment - 11/12/14 1445    Prosthetics   Prosthetic Care Dependent with Skin check;Residual limb care;Care of non-amputated limb;Prosthetic cleaning;Ply sock cleaning;Correct ply sock adjustment;Proper wear schedule/adjustment;Proper weight-bearing  schedule/adjustment;Other (comment)  donning prosthesis   Donning prosthesis  Min assist   Doffing prosthesis  Supervision   Current prosthetic wear tolerance (days/week)  worn 2 times since delivery 15 days ago   Current prosthetic wear tolerance (#hours/day)  worn 30 min up to 2hrs    Current prosthetic weight-bearing tolerance (hours/day)  toelrated 5 min of standing / gait with partial wt on prosthesis without pain or discomfort   Edema non-pitting   Residual limb condition  wound packed with gauze, PT changed to Tegaderm covering packing with prosthesis wear                  [facility] Adult PT Treatment/Exercise - 11/12/14 1445    Prosthetics   Education Provided Skin check;Residual limb care;Care of non-amputated limb;Prosthetic cleaning;Ply sock cleaning;Correct ply sock adjustment;Proper wear schedule/adjustment;Proper weight-bearing schedule/adjustment;Other (comment)  donning   Person(s) Educated Patient;Spouse   Education Method Explanation;Demonstration;Tactile cues;Verbal cues   Education Method Verbalized understanding;Returned demonstration;Tactile cues required;Verbal cues required;Needs further instruction  PT Education - 11-28-14 1532    Education provided Yes          PT Short Term Goals - 11/28/14 1445    PT SHORT TERM GOAL #1   Title donnes prosthesis correclty modified independent. (Target Date: 12/11/2014)   Time 4   Period Weeks   Status New   PT SHORT TERM GOAL #2   Title tolerates wear of prosthesis >8hrs total per day without skin issues or discomfort. (Target Date: 12/11/2014)   Time 4   Period Weeks   Status New   PT SHORT TERM GOAL #3   Title standing balance with rolling walker & prosthesis reaching 10" all directions, to floor, and manages clothes including toileting modified independent. (Target Date: 12/11/2014)   Time 4   Period Weeks   Status New   PT SHORT TERM GOAL #4   Title ambulates 150' with rolling walker &  prosthesis with supervision. (Target Date: 12/11/2014)   Time 4   Period Weeks   Status New   PT SHORT TERM GOAL #5   Title negotiates ramp, curb with RW and stairs wtih 2 rails with prosthesis with supervision. (Target Date: 12/11/2014)   Time 4   Period Weeks   Status New           PT Long Term Goals - 11-28-2014 1445    PT LONG TERM GOAL #1   Title independent with prosthetic care. (Target Date: 01/08/2015)   Time 8   Period Weeks   Status New   PT LONG TERM GOAL #2   Title toelrates wear of prosthesis >90% of awake hours without issues. (Target Date: 01/08/2015)   Time 8   Period Weeks   Status New   PT LONG TERM GOAL #3   Title Berg Balance >24/56 with prosthesis. (Target Date: 01/08/2015)   Time 8   Period Weeks   Status New   PT LONG TERM GOAL #4   Title ambulates 300' with LRAD & prosthesis modified independent. (Target Date: 01/08/2015)   Time 8   Period Weeks   Status New   PT LONG TERM GOAL #5   Title negotiates ramp, curb and stairs with LRAD modified indepenent. (Target Date: 01/08/2015)   Time 8   Period Weeks   Status New               Plan - Nov 28, 2014 1530    Clinical Impression Statement [name] is dependent in use of her first prosthesis. She needs further skilled instruction to safely wear and care for herself with new prosthesis. Patient has high fall risk noted by [place] Cage of 8/56 along with dependency in ADLs and gait dependency.    Pt will benefit from skilled therapeutic intervention in order to improve on the following deficits Abnormal gait;Decreased activity tolerance;Decreased balance;Decreased endurance;Decreased knowledge of precautions;Decreased knowledge of use of DME;Decreased mobility;Decreased range of motion;Decreased strength   Rehab Potential Good   PT Frequency 2x / week   PT Duration 8 weeks   PT Treatment/Interventions ADLs/Self Care Home Management;DME Instruction;Gait training;Stair training;Functional mobility  training;Therapeutic activities;Therapeutic exercise;Balance training;Neuromuscular re-education;Patient/family education;Other (comment)  prosthetic training   PT Next Visit Plan review prosthetic care, HEP, gait with RW, instruct in barriers   PT Home Exercise Plan mid-line at sink   Consulted and Agree with Plan of Care Patient;Family member/caregiver   Family Member Consulted fiance          G-Codes - 2014/11/28 1445    Functional  Assessment Tool Used dependent in prosthetic care, worn prosthesis 2 of 15 days since delivery for 84min to 2hours at time   Functional Limitation Self care   Self Care Current Status [phone]) At least 80 percent but less than 100 percent impaired, limited or restricted   Self Care Goal Status [id number]) At least 1 percent but less than 20 percent impaired, limited or restricted       Problem List There are no active problems to display for this patient.   Jamey Reas PT, DPT 11/13/2014, 2:12 PM  [facility] [address] [place], [place], [address] Phone: [phone]   Fax:  [phone]

## 2014-11-27 NOTE — Op Note (Signed)
PATIENT NAME:  Natalie [name], Natalie [name] MR#:  [id number] DATE OF BIRTH:  October 12, 1955  DATE OF PROCEDURE:  06/30/2013  PREOPERATIVE DIAGNOSES: 1.  Peripheral arterial disease with rest pain, left lower extremity.  2.  Hypertension.  3.  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  POSTOPERATIVE DIAGNOSES: 1.  Peripheral arterial disease with rest pain, left lower extremity.  2.  Hypertension.  3.  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  PROCEDURES PERFORMED: 1.  Ultrasound guidance for vascular access to bilateral femoral arteries.  2.  Catheter placement to aorta from bilateral femoral approaches.  3.  Percutaneous transluminal angioplasty of the left external iliac artery with 6 mm diameter angioplasty balloon.  4.  Self-expanding stent placement to left external iliac artery for greater than 50% residual stenosis after angioplasty with a 7 mm diameter self-expanding stent.   SURGEON: Algernon Huxley, MD  ANESTHESIA: Local with moderate conscious sedation.   ESTIMATED BLOOD LOSS: 25 mL.   INDICATION FOR PROCEDURE: This is an individual came to the office with ischemic rest pain of the left foot. She had a reported ABI of 0.5 on the left. She had not had previous vascular evaluation to her knowledge. She is brought in today for angiography for further evaluation of her anatomy and possible treatment. Risks and benefits were discussed. Informed consent was obtained.   DESCRIPTION OF PROCEDURE:  The patient is brought to the vascular interventional radiology suite. Groins were shaved and prepped and a sterile surgical field was created. The right femoral artery was initially visualized with ultrasound and accessed under direct ultrasound guidance with a Seldinger needle. A J-wire and 5-French sheath was placed. The flow was sluggish and the wire would not pass beyond the iliac bifurcation. Imaging showed occlusion at the level of the hypogastric artery origin on the right. I spent several minutes trying to cross  this occlusion without success with an advantage wire and Kumpe catheter and could not gain intraluminal flow.  I could get into a subintimal plane in the aorta but could never get back into the true lumen.   I then accessed the left femoral artery. This was done under direct ultrasound guidance as well without difficulty with a Seldinger needle. A J-wire was placed. There was some occlusion in the left external iliac artery but after placing sheath I was able to cross this with little difficulty and confirm intraluminal flow with a Kumpe catheter in the aorta. Pigtail catheter was placed in the aorta and then pulled down the aortic bifurcation for oblique pelvic images. The renal arteries were patent bilaterally. The aorta was patent. The right common iliac artery had a flush occlusion, I did not even see a stump of an origin. The left common iliac artery is patent but then the left external iliac artery was occluded and then reconstituted in the left femoral artery just above the location of our sheath. I replaced a Magic torque wire. I treated the external iliac artery lesion with a 6 mm diameter angioplasty balloon with near occlusive residual stenosis and dissection, and a 7 mm diameter angioplasty balloon was inflated from the distal common iliac artery on the left down to just above the femoral head, only about a centimeter or 2 above our access site. This was post dilated with 6 mm balloon and actually had good flow through the stent. There was still disease within the common femoral that was in the moderate range. I did not perform completion left lower extremity runoff due  to contrast limitations given her chronic kidney disease.   At this point, with the left leg being the leg that was symptomatic and this having improved the perfusion in the left leg, I elected to terminate the procedure.  No further attempts are going to be performed on trying to improve her right lower extremity perfusion unless  she has worsening symptoms. Both sheaths were removed, pressure was held manually and sterile dressing was placed. The patient tolerated the procedure well and was taken to the recovery room in stable condition.     ____________________________ Algernon Huxley, MD jsd:cs D: 06/30/2013 13:26:28 ET T: 06/30/2013 14:29:41 ET JOB#: [id number]  cc: Algernon Huxley, MD, <Dictator> Algernon Huxley MD ELECTRONICALLY SIGNED 07/14/2013 9:21

## 2014-11-28 NOTE — Discharge Summary (Signed)
PATIENT NAME:  Natalie [name], SANANGELO MR#:  [id number] DATE OF BIRTH:  [birth year]  DATE OF ADMISSION:  04/10/2014 DATE OF DISCHARGE:  04/16/2014  DATE OF PROCEDURES: September 5th and September 9th 2015.  DISPOSITION: Geophysicist/field seismologist.  ADMITTING AND DISCHARGE DIAGNOSES:  1.  Peripheral arterial disease with gangrene, left lower extremity, status post aortobifemoral bypass.  2.  Open wound of left groin and thigh with infection.   PROCEDURES PERFORMED: I and D of left groin and thigh wounds with VAC dressing placement. For full details, please see those dictated histories.  BRIEF HISTORY: This is a 59 year old female with severe peripheral vascular disease that has been refractory to endovascular therapy. About a month ago, she underwent aortobifemoral bypass in order to improve her perfusion. She has thick skin changes with gangrene of her left forefoot. She was admitted back to the hospital after a clinic visit showing an open left groin wound as well as a left thigh wound and prepared for debridement.   HOSPITAL COURSE: The patient was taken to the operating room the following morning where debridement of the wounds were performed with VAC dressing changes. Cultures were taken and her antibiotics have been tailored to the E. coli and Klebsiella found in the wounds. She still has thick changes of her forefoot and will likely require transmetatarsal amputation at a later date.  After her second VAC change, which she went to the operating room for, the tissue was improving and VAC changes from here on out should able to be done at the facility. We will plan to see her back in 2 weeks in the office.  DISCHARGE MEDICATIONS: Include: 1.  Q-var 80 mcg 2 puffs b.i.d.  2.  Spiriva 18 mcg daily.  3.  Fluticasone 50 mcg 2 sprays daily.  4.  Norvasc 10 mg daily.  5.  Lovastatin 20 mg daily.  6.  Allopurinol 100 mg daily. 7.  Aspirin 81 mg daily. 8.  Colchicine 0.6 mg as needed.  9.  Prilosec 10 mg  daily.  10.  Nicotine 21 mg transdermal patch daily. 11.  Colace 200 mg b.i.d.  12.  Percocet 7.5/325 every 4 hours as needed.  13.  Levofloxacin 750 mg every 48 hours.  14.  Augmentin 875 mg b.i.d.  15.  Lovenox 40 mg subcutaneous daily.  DISCHARGE INSTRUCTIONS: Followup will be in 2 weeks in our office. Her VAC dressing should be changed twice weekly at her facility and a dry dressing should be kept on her left foot and ankle wounds. They are to call or contact our office with any changes, worsening pain, severe wound problems, fever greater than 101 or other issues. ____________________________ Algernon Huxley, MD jsd:sb D: 04/16/2014 11:21:00 ET T: 04/16/2014 11:44:01 ET JOB#: [id number]  cc: Algernon Huxley, MD, <Dictator> Algernon Huxley MD ELECTRONICALLY SIGNED 04/20/2014 15:06

## 2014-11-28 NOTE — Op Note (Signed)
PATIENT NAME:  Natalie [name], Natalie [name] MR#:  [id number] DATE OF BIRTH:  Nov 16, 1955  DATE OF PROCEDURE:  05/22/2014  PREOPERATIVE DIAGNOSES: 1.  Atherosclerotic occlusive disease, bilateral lower extremities, with gangrene of the left foot.  2.  Left thigh wound.   POSTOPERATIVE DIAGNOSES:  1.  Atherosclerotic occlusive disease, bilateral lower extremities, with gangrene of the left foot.  2.  Left thigh wound.   PROCEDURES PERFORMED: 1.  Left above-knee amputation.  2.  Debridement of left thigh wound.   SURGEON: Hortencia Pilar, MD   ANESTHESIA: General by endotracheal intubation.   FLUIDS: Per anesthesia record.   ESTIMATED BLOOD LOSS: 100 mL.   SPECIMEN:  1.  Left lower extremity to pathology for gross description. 2.  Debrided material from the thigh wound is not sent for specimen.   INDICATIONS: Natalie [name] is a 59 year old woman who has undergone revascularization. Unfortunately, this has not improved her left lower extremity and she now has extensive gangrene of the left foot as well as the heel and Achilles area. She also has a large lateral thigh wound. The risks and benefits for amputation have been reviewed, all questions have been answered, and the patient has agreed to proceed with above-knee amputation.   DESCRIPTION OF PROCEDURE: The patient is taken to the operating room and placed in the supine position. After adequate general anesthesia is induced and appropriate invasive monitors are placed, her left leg is prepped and draped in a circumferential fashion from the ankle all the way up to the groin. It is then draped in a sterile fashion. Appropriate timeout is called.   Working approximately 2 fingerbreadths above the patella, umbilical tape is used to make a circumferential line around the thigh and then circumferential incision is made with a 10 blade. Saphenous vein is controlled with hemostats. The incision is then carried down to expose the fascia, which is then  incised, again in a circumferential fashion. Muscle bellies are transected with scalpel and Bovie cautery. Bleeding points are cauterized as they are encountered or ligated between 2-0 silk ties. The SFA and SFV are both identified and then looped circumferentially with 0 Vicryl sutures. The vessels are then ligated and divided. The periosteum is raised from the femur. The femur is then transected with a Gigli saw. The posterior muscle bellies are then transected with an amputation knife. Hemostasis is obtained with hemostats. The hemostats are then controlled with 3-0 Vicryl in a figure-of-eight fashion. The wound is then irrigated, rasp is used to smooth the edges of the femur and then the fascia is reapproximated using interrupted figure-of-eight 0 Vicryl sutures, followed by closing the skin with a combination of 3-0 nylon interrupted mattress sutures and staples. A silver foam VAC dressing is applied over the incision.   Attention is then turned to the lateral thigh wound. By examination the wound has extensive undermining. It appears to be approximately 3 cm deep, approximately 4 x 3 cm in diameter and therefore it is elected to debride the skin and soft tissues to open this area up eliminating the undermining so that a VAC dressing sponge can be more appropriately placed. Using rat-tooth forceps and a 1- blade scalpel, skin and subcutaneous tissues are debrided. This is an excisional debridement using scalpel. Hemostasis is then obtained with Bovie cautery. The wound is then irrigated and noted to have relatively confluent granulation tissue noted. There does not appear to be any tracking along the fascia lata. Silver foam VAC sponge is then placed followed by  the dressing using a Y connector. Both areas are then hooked to a VAC which is set to 125 mmHg continuous negative pressure and a good seal is identified.   The patient tolerated the procedure well. There were no immediate complications. Sponge and  needle counts are correct. She is taken to the recovery area in excellent condition.  ____________________________ Katha Cabal, MD ggs:sb D: 05/22/2014 16:08:00 ET T: 05/22/2014 17:01:25 ET JOB#: [id number]  cc: Katha Cabal, MD, <Dictator> Katha Cabal MD ELECTRONICALLY SIGNED 05/25/2014 20:46

## 2014-11-28 NOTE — Op Note (Signed)
PATIENT NAME:  Natalie [name], Natalie [name] MR#:  [id number] DATE OF BIRTH:  09-Feb-1956  DATE OF PROCEDURE:  01/08/2014  PREOPERATIVE DIAGNOSES:  1.  Peripheral arterial disease with claudication bilateral lower extremities and symptoms worrisome for rest pain in the left lower extremity.  2.  Previous toe amputation for gangrenous changes.  3.  Hypertension.  4.  History of stroke.   POSTOPERATIVE DIAGNOSES: 1.  Peripheral arterial disease with claudication bilateral lower extremities and symptoms worrisome for rest pain in the left lower extremity.  2.  Previous toe amputation for gangrenous changes.  3.  Hypertension.  4.  History of stroke.   PROCEDURES:  1.  Ultrasound guidance for vascular access to bilateral femoral arteries.  2.  Catheter placement in the aorta from bilateral femoral approaches.  3.  Aortogram and iliofemoral arteriogram.  4.  Bilateral lower extremity angiograms from ipsilateral femoral approaches.  5.  Catheter directed thrombolysis with 4 mg of TPA to the left common femoral artery, iliac artery and aorta.  6.  Percutaneous transluminal angioplasty of left external iliac artery and proximal common femoral artery with 6 mm diameter angioplasty balloon.  7.  StarClose closure device, right femoral artery.   SURGEON: Leotis Pain, M.D.   ANESTHESIA: Local with moderate conscious sedation.   ESTIMATED BLOOD LOSS: 25 mL.   INDICATION FOR PROCEDURE: This is a 59 year old female with advanced peripheral vascular disease. She has already had an intervention to the left iliac for occlusion. She had a right iliac occlusion at that time as well. She had toe amputation on the left for gangrenous changes. She comes back in with worsening pain in the left lower extremity. She has symptoms that are consistent with claudication of both lower extremities and symptoms worrisome for rest pain of the left lower extremity. Her pain has progressed even since her visit to the office and she now  describes pain in her foot at night. She is brought in for angiography for further evaluation and potential treatment to see if any endovascular therapy can be performed for her disease. The risks and benefits were discussed. Informed consent was obtained.   DESCRIPTION OF PROCEDURE: The patient is brought to the vascular suite. Groins were shaved and prepped and a sterile surgical field was created. Due to the pulseless nature of the groins, ultrasound was used for access. I initially started by accessing the right femoral artery. This was done under direct ultrasound guidance with a micropuncture needle and micropuncture wire and sheath were placed tried. I tracked across the occlusion and gained access to the true lumen of the aorta from the right femoral approach, but I was unable to get back into the true lumen of the aorta and remained in a dissection plane. There was reconstitution of the femoral mostly through large circumflex collaterals off of the common femoral artery and imaging was performed through the sheath to demonstrate the right lower extremity runoff. This was actually pretty good. The SFA was patent. There was, what appeared to be, reasonable tibial flow at the tibial vessels, although the flow was somewhat sluggish and difficult to opacify distally. There did appear to be 1 or 2 vessel runoff distally. Given the findings on the right and inability to get into the true lumen and get into the aorta,  I abandoned this approach. Since the left was the more severely symptomatic leg, I accessed the left femoral as low as possible under ultrasound guidance just above the femoral bifurcation with a micropuncture needle. A  micropuncture wire and sheath were then placed. Imaging showed the previous stent to be occluded and had a thrombus throughout the stent and up into the aorta. It was difficult to discern, but I could gain access to the true lumen of the aorta up around the visceral vessels. It was  hard to tell where the aorta was actually occluded through this Kumpe catheter.   I elected to place 4 mg of tPA with the AngioJet AVX catheter from the left common femoral artery up to the iliac system and into the aorta, but not up around the visceral vessels. This was allowed to dwell for 15 minutes. While this was dwelling, I performed StarClose closure device of the right femoral access site with a good hemostatic result. I then elected to treat the areas in the left iliac system and up into the distal aorta with a 6 mm diameter angioplasty balloon. I was able to treat from the distal aorta down to the proximal common femoral artery as we had accessed low in the left common femoral artery. I brought the sheath down to just to the access location. Narrowing was seen with balloon inflation and there was improved flow, although there was still some rattiness of the common femoral artery, but there was no way to place a stent down this low. The flow was much more brisk up to the aorta on injections through the femoral sheath. I performed runoff the left lower extremity. The vessels were smaller, but again the SFA appeared to be continuous. The popliteal was continuous and there appeared to be 1 to 2 vessels distally and run off the tibials. At this point, the sheath was removed. Pressure was held for 15 minutes and sterile pressure dressing was placed. The patient tolerated the procedure well and was taken to the recovery room in stable condition.   ____________________________ Algernon Huxley, MD jsd:aw D: 01/08/2014 10:43:42 ET T: 01/08/2014 11:22:49 ET JOB#: [id number]  cc: Algernon Huxley, MD, <Dictator> Algernon Huxley MD ELECTRONICALLY SIGNED 01/16/2014 12:34

## 2014-11-28 NOTE — Discharge Summary (Signed)
PATIENT NAME:  Natalie [name], HUNDT MR#:  [id number] DATE OF BIRTH:  [birth year]  DATE OF ADMISSION:  03/25/2014 DATE OF DISCHARGE:    ADMITTING AND DISCHARGE DIAGNOSES:  1.  Peripheral arterial disease with ulceration and gangrene, left lower extremity.  2.  Aortoiliac occlusion.  3.  Severe hypertension.  4.  Left renal artery thrombus/stenosis posterior diagonal.   PROCEDURES PERFORMED IN HOSPITAL: Aortobifemoral bypass, aortic endarterectomy, left renal artery endarterectomy, right femoral artery endarterectomy. For full details of that, please see that dictated operative summary.   BRIEF HISTORY: This is a 59 year old female well known to me for her severe peripheral vascular disease. She has undergone 2 previous percutaneous revascularizations and a fifth toe amputation on the left. She has developmental new ulcers and gangrenous changes in her toes, and has failed a reasonably recent left iliac intervention. 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 She is brought in for open surgical bypass and repair of these issues for revascularization.   HOSPITAL COURSE: The patient was admitted to same-day surgery and taken to the Operating Room where the above procedure was performed. For full details, please see the dictated operative summary. At that time, her disease was even more extensive than expected and she required a suprarenal clamp and a left renal endarterectomy, as well as her aortobifemoral bypass, aortic endarterectomy, and right femoral endarterectomy. She did reasonably well. She was in the Critical Care Unit for 4 days postoperatively, but day by day was able to improve. Her NG tube was removed on day 2. She began diuresis and had poor mobility, so her Foley catheter was left in for several days until her mobility improved on postoperative day #5. She still was not walking well and worked with physical therapy on multiple occasions and was found to have  dizziness and difficulty with ambulation, and they recommended rehab and skilled nursing after discharge. On postoperative day #7 she was afebrile and vital signs stable. She had been tolerating a regular diet for 2 days. She had her catheter removed and urinated. She had bowel activity. Her wounds were clean, dry, and intact from her surgical sites, and her toes were demarcating from the gangrenous changes. She still has multiple left lower extremity wounds and local dressing care will be necessary with dry dressings daily. Her diet will be regular. Her activity will be as tolerated, but no heavy lifting for 3 more weeks. We will see her back in the office in about 2 weeks for wound check and likely staple removal.   DISCHARGE MEDICATIONS: Include:  1.  QVAR 80 mcg inhaled 2 puffs 2 times a day.   2.  Spiriva 18 mcg inhaled 1 cap daily.  3.  Fluticasone 50 mcg 2 sprays daily.  4.  Norvasc 10 mg daily.  5.  Lovastatin 20 mg daily.  6.  Allopurinol 100 mg daily.  7.  Aspirin 81 mg daily.  8.  Plavix 75 mg daily.  9.  Augmentin 875/125 b.i.d.  10.   Colchicine for gout flares.  11.   Prilosec 10 mg daily.  12.   Nicotine patch 21 mcg daily.  13.   Percocet 7.5/325 every 4 hours as needed for pain.  14.   Colace 200 mg b.i.d.   She will return to see [place] in the office in 2 weeks or sooner if problems develop in the interim.    ____________________________ Algernon Huxley, MD jsd:at D: 04/01/2014 16:57:30 ET T: 04/01/2014 17:15:51 ET JOB#: [id number]  cc: Algernon Huxley, MD, <Dictator> Algernon Huxley MD ELECTRONICALLY SIGNED 04/06/2014 11:49

## 2014-11-28 NOTE — H&P (Signed)
PATIENT NAME:  Natalie [name], Natalie [name] MR#:  [id number] DATE OF BIRTH:  1956/02/15  DATE OF ADMISSION:  01/08/2014  REFERRING PHYSICIAN: Dr. Delana Meyer  PRIMARY PHYSICIAN: Nonlocal  CHIEF COMPLAINT: Left lower limb weakness.   HISTORY OF PRESENTING ILLNESS: This is a 59 year old female who has a past medical history of hypertension, hyperlipidemia, peripheral vascular disease, and COPD. Had angiogram and left leg stent placement in the past. Had chronic worsening, and in the office with Dr. Lucky Cowboy and Dr. Delana Meyer, sonogram and Doppler study were done which showed in bilateral lower extremity so she was called for the procedure today as outpatient by Dr. Leotis Pain.  Procedure was done for aortogram and ileofemoral arteriogram.  Catheter directed thrombolysis of left common femoral artery, iliac artery, and aorta. After the procedure, the plan was to send her back home, but in the recovery room noticed that she was found having left lower limb weakness and numbness and so hospitalist team was called in to evaluate her, and if needed, then admit her. During my examination, I asked her and she denies any other injuries.  She denies similar symptoms in the past, but currently she is alert since procedure. She is feeling numb in her left lower extremity and she is able to move it but feeling very weak in that and she was also feeling some heaviness or tightness in her thigh. Does not feel any headache or dizziness or any weakness in any other extremities.   REVIEW OF SYSTEMS:  CONSTITUTIONAL: Negative for fever, fatigue, weakness, weight loss.  EYES: No blurring, double vision, discharge or redness.  EARS, NOSE, THROAT: No tinnitus, ear pain, or hearing loss.  RESPIRATORY: No cough, wheezing, hemoptysis, or shortness of breath.  CARDIOVASCULAR: No chest pain, orthopnea, edema, arrhythmia or palpitations. GASTROINTESTINAL: No nausea, vomiting, diarrhea, abdominal pain.  GENITOURINARY: No dysuria, hematuria, or  increased frequency.  ENDOCRINE: No heat or cold intolerance. No excessive sweating.  NEUROLOGICAL: There is numbness and weakness in left lower extremity.  All other extremities are okay. No tremor or vertigo.  PSYCHIATRIC: No anxiety, insomnia, bipolar disorder. JOINTS: No swelling or tenderness.   PAST MEDICAL HISTORY:  1. Hypertension.  2. Hyperlipidemia.  3. Peripheral vascular disease.  4. Chronic obstructive pulmonary disease.   PAST SURGICAL HISTORY: Angiogram and left leg stent placement. Left 5th toe amputation done 10 years ago.   SOCIAL HISTORY: Lives at home, very functional at baseline. Not working. Smokes 3-4 cigarettes every day and denies any alcohol use.   FAMILY HISTORY: Mother had kidney problem and father had brain tumor.   HOME MEDICATIONS:  1. Spiriva 18 mcg inhalation once a day.  2. Qvar 80 mcg inhalation 2 puffs inhaled 2 times a day.  3. Omeprazole 40 mg oral once a day.  4. Lovastatin 20 mg oral once a day.  5. Fluticasone 50 mcg oral inhalation 2 sprays once a day.  6. ProAir HFA 90 mcg inhalation 2 puffs 4 times a day.  7. Docusate 100 mg oral 2 times a day.  8. Amlodipine 10 mg oral once a day.  9. Allopurinol 100 mg oral once a day.  10. Acetaminophen and hydrocodone 5 mg oral tablet every 4 hours as needed for pain.   VITAL SIGNS: Currently, temperature 98.4, pulse 82, respirations 18, blood pressure 158/82, and pulse oximetry is 92% on room air.   PHYSICAL EXAMINATION: GENERAL: Patient is fully alert and oriented to time, place, and person. Does not appear in any acute distress. She is  obese.  HEENT: Head and neck atraumatic. Conjunctivae pink. Oral mucosa moist.  NECK: Supple. No JVD.  RESPIRATORY: Bilateral equal and clear air entry.  CARDIOVASCULAR: S1, S2 present, regular. No murmur.  ABDOMEN: Soft, nontender. Bowel sounds present. No organomegaly.  SKIN: No rashes. Legs: No edema.  NEUROLOGICAL: In other 3 limbs, except left lower, power  is 5/5 and sensation is preserved. No tremors on left lower limb.  The power is 3/5 and there is some tenderness on thigh muscles on the lateral side. are altered, but the reflexes are preserved.  PSYCHIATRIC: Does not appear any psychiatric illness at this time.  JOINTS: No swelling or tenderness.   LABORATORY RESULTS:  BUN 16, creatinine 1.22.  Ordered other labs and we will have to follow.   ASSESSMENT AND PLAN: A 59 year old female with history of hypertension, hyperlipidemia, and chronic obstructive pulmonary disease came to the hospital for the scheduled procedure for left lower extremity and after having angiogram and breaking the atherosclerosis.  1. Left lower extremity, weakness, and numbness after procedure. Most likely, it might be secondary to muscular pains after procedure but we cannot neglect a thrombotic or thromboembolic event, so we will do a CT scan and call neurology consult for further management of this issue. The patient is already on aspirin and Plavix for vascular procedure.  We will just continue the same and get physical therapy.  2. Hypertension. Blood pressure is under control currently, so we will continue home medications like amlodipine.  3. Hyperlipidemia. We will continue lovastatin.  4. Chronic obstructive pulmonary disease. Currently, no active wheezing. We will continue Spiriva and nebulizer as needed.  5. Tobacco abuse. She is a smoker. Cessation counseling is done for 4 minutes and offered a nicotine patch. She agreed to use it in the hospital.   TOTAL TIME SPENT ON THIS ADMISSION: 50 minutes.   ____________________________ Ceasar Lund Anselm Jungling, MD vgv:dd D: 01/08/2014 16:01:56 ET T: 01/08/2014 17:55:45 ET JOB#: [id number]  cc: Ceasar Lund. Anselm Jungling, MD, <Dictator> Algernon Huxley, MD  Vaughan Basta MD ELECTRONICALLY SIGNED 01/21/2014 9:04

## 2014-11-28 NOTE — Op Note (Signed)
PATIENT NAME:  Natalie [name], Natalie [name] MR#:  [id number] DATE OF BIRTH:  05-14-1956  DATE OF PROCEDURE:  04/11/2014  PREOPERATIVE DIAGNOSIS: Infected left groin, status post aortobifemoral bypass graft.  POSTOPERATIVE DIAGNOSIS: Infected left groin, status post aortobifemoral bypass graft.   PROCEDURE PERFORMED: 1. Incision and drainage of left groin.  2. Placement of wound VAC into left groin.  3. Debridement of left thigh wound.  4. Placement of wound VAC to left thigh wound.   CULTURES: Anaerobic and aerobic cultures were obtained from the left groin.   FINDINGS: Graft was not visualized. Necrotic fat was debrided from both wounds.   BLOOD LOSS: Is minimal.   INDICATIONS: This is a 59 year old female who has recently undergone aortobifemoral bypass graft for severe peripheral vascular disease. She was found to have an open groin wound. She comes in today for debridement. The procedure was discussed in detail with the patient and her fiancee in the holding area.   DESCRIPTION OF PROCEDURE:  After the patient was identified in the holding area and taken to Room 8, general LMA anesthesia was induced. The patient was prepped and draped in the usual fashion. A timeout was called. I began with sharp debridement of the left groin. Necrotic fat was removed back to healthy bleeding tissue. Wound cultures were obtained and sent to microbiology. The wound was then irrigated. There was no graft readily visualized. Once the necrotic tissue was sharply debrided free, hemostasis was achieved with Bovie cautery. The wound was then copiously irrigated. Once I was satisfied with hemostasis and good remaining tissue bed, a wound VAC was placed with a black sponge and connected to suction. I then exposed the wound in the lateral left thigh. This wound was approximately 3 cm in diameter, but very deep. It tracked down to her muscle. Again, necrotic fat was debrided. Once there was healthy, viable tissue in the wound, it  was packed with a black sponge and connected to the wound VAC. The patient was then successfully awakened from anesthesia and taken to the recovery room in stable condition. There were no immediate complications.      ____________________________ Serafina Mitchell, MD vwb:TT D: 04/11/2014 16:14:45 ET T: 04/11/2014 16:33:42 ET JOB#: [id number]  cc: Serafina Mitchell, MD, <Dictator> Serafina Mitchell MD ELECTRONICALLY SIGNED 04/12/2014 21:39

## 2014-11-28 NOTE — Discharge Summary (Signed)
PATIENT NAME:  Natalie [name], Natalie [name] MR#:  [id number] DATE OF BIRTH:  [birth year]  DATE OF ADMISSION:  01/08/2014 DATE OF DISCHARGE:  01/09/2014  PRIMARY CARE PHYSICIAN:  Unknown, nonlocal.  FINAL DIAGNOSES: 1.  Left leg weakness and pain probably from painful nodules felt over the lateral hip area.  2.  Severe peripheral vascular disease.  3.  Tobacco abuse.  4.  Hypertension.  5.  Chronic obstructive pulmonary disease.  6.  Gastroesophageal reflux disease.   MEDICATIONS ON DISCHARGE: Include Qvar 80 mcg per inhalations 2 puffs twice a day, ProAir HFA 2 puffs 4 times a day as needed for shortness of breath, Spiriva 1 inhalation daily, fluticasone 50 mcg per inhalations 2 sprays each nostril daily, amlodipine 10 mg daily, lovastatin 20 mg daily, allopurinol 100 mg in the morning, omeprazole 40 mg daily, acetaminophen/hydrocodone 325/5 mg 1 tablet every 4 hours as needed for pain, Colace 100 mg twice a day, prednisone 5 mg 5 tablets day 1, 4 tablets day 2, 3 tablets day 3, 2 tablets day 4, 1 tablet day 5 and 6, then stop. Aspirin 81 mg daily, Plavix 75 mg daily, nicotine patch 21 mg chest wall film daily.   ACTIVITY: As tolerated.  FOLLOWUP: Dr. Lucky Cowboy and follow up in 1 to 2 weeks with your medical doctor.   DIET: Low sodium diet, regular consistency.   HOSPITAL COURSE: The patient was admitted 01/08/2014 and discharged 01/09/2014. Came in with left lower limb weakness after an outpatient procedure by Dr. Lucky Cowboy.  CT scan of the head was ordered and neurology consult. The patient was already started on aspirin and Plavix for the vascular procedure.  LABORATORY AND RADIOLOGICAL DATA DURING THE HOSPITAL COURSE: Included a creatinine 1.2, white blood cell count 12.5, H and H 13.2 and 40.7, platelet count of 423, glucose 113, BUN 14, another creatinine was 1.08. Sodium 137, potassium 3.8, chloride 102, CO2 of 27.  CT scan of the head: Advanced atrophy and microvascular ischemic disease without acute  intracranial process.  LDL 92, HDL 25, triglycerides 131, hemoglobin A1c 6.4.  Creatinine upon discharge 145, potassium 3.2, white blood cell count 9.1.   1.  The patient was seen in consultation by neurology who did not believe that this is a stroke and started medication for muscle spasm, baclofen. When I saw the patient, the patient did have painful nodules on the left leg and also right leg hip area. The patient has had these for a long period of time. She states that she cannot even lie on her left leg secondary to the pain. I decided to give a stat dose of Solu-Medrol and a prednisone taper from these painful nodules. If the steroids do not make this pain improve, I would recommend following up with an orthopedic surgeon as outpatient. No signs of infection were seen on that area. The patient was seen in consultation by physical therapy who recommended a walker, which I did write a script for. Also home health was set up. The patient does have some numbness on the lower left lower extremity as per Dr. Lucky Cowboy that this is not new.  2.  Severe peripheral vascular disease. The patient had a procedure by Dr. Lucky Cowboy. Please see operative report. The patient on aspirin and Plavix.  3.  Tobacco abuse. Smoking cessation counseling done during the hospital course.  4.  Hypertension. Stable on current medications.  5.  Chronic obstructive pulmonary disease. Respiratory status stable on inhalers.  6.  Gastroesophageal reflux disease, on Protonix.  TIME SPENT ON DISCHARGE:  35 minutes.    ____________________________ Tana Conch. Leslye Peer, MD rjw:ce D: 01/09/2014 13:57:52 ET T: 01/09/2014 14:26:16 ET JOB#: [id number]  cc: Tana Conch. Leslye Peer, MD, <Dictator> Primary care physician Algernon Huxley, MD Marisue Brooklyn MD ELECTRONICALLY SIGNED 01/13/2014 10:19

## 2014-11-28 NOTE — Discharge Summary (Signed)
PATIENT NAME:  Natalie [name], Natalie [name] MR#:  [id number] DATE OF BIRTH:  05-10-1956  DATE OF ADMISSION:  08/17/2013 DATE OF DISCHARGE:  08/22/2013   ADMITTING DIAGNOSIS: Acute osteomyelitis.   DISCHARGE DIAGNOSES:  1.  Left foot methicillin-sensitive Staphylococcus aureus abscess, status post incision and drainage on the 13th of January 2015 by Dr. Cleda Mccreedy.  2.  Peripheral vascular disease, status post recent angioplasty of left leg vessels and stent placement, left foot toe amputation by Dr. Lucky Cowboy.  3.  Chronic obstructive pulmonary disease.  4.  Right middle lobe, right lower lobe atelectasis.  5.  Left lower lobe atelectasis, suspected due to mucous plug.  6.  Hypoxia, resolved. 7.  Hypomagnesemia. 8.  History of hypertension.  9.  History of hyperlipidemia.  10.  Tobacco abuse.  11.  Acute renal failure due to angiotensin-converting enzyme inhibitor as well as intravenous dye.  DISCHARGE CONDITION: Stable.   DISCHARGE MEDICATIONS: The patient is to resume:  1.  QVAR 2 puffs twice daily. 2.  ProAir HFA 2 puffs 4 times daily as needed.  3.  Spiriva 1 capsule inhalation once daily.  4.  Fluticasone 2 sprays to each nostril once daily.  5.  Amlodipine 10 mg p.o. daily.  6.  Lovastatin 20 mg p.o. daily.  7.  Allopurinol 100 mg p.o. daily.  8.  Omeprazole 40 mg p.o. daily.  9.  Acetaminophen/hydrocodone 325/5 mg 1 tablet every 4 hours as needed.  10.  Amoxicillin clavulanate 875 mg/125 mg 1 tablet every 12 hours for 14 days.  11.  Guaifenesin 600 mg p.o. twice daily.  12.  Senna 1 tablet once daily as needed.  13.  Docusate sodium 100 mg p.o. twice daily.  14.  Nicotine transdermal patch 14 mg topically daily.   The patient was advised not to take Colcrys, HCTZ, or lisinopril unless recommended by primary care physician.  The patient is going to be discharged to home with home health physical therapy as well as nurse and wound care, with instructions of left foot wound sterile saline gauze  packing every other day. She was also advised to drink plenty of fluids so she would have good urinary output, frequent urination. She is to follow up with Dr. Cleda Mccreedy in 1 week after discharge, as well as [place] in 2 to 3 days after discharge.   The patient's creatinine will be checked after IV fluid load and if her creatinine has improved, she will be discharged home. However, if her creatinine remains stable and does not improve, she will likely stay here for further treatment of her acute renal failure.   CONSULTANTS: Dr. Sharlotte Alamo, Dr. Leotis Pain, care management, social work.   RADIOLOGIC STUDIES: Left foot complete x-ray, 11th of January 2015, revealed status post amputation of fifth toe. Erosive changes were noted in the distal aspect of fifth metatarsal, highly suspicious for osteomyelitis. Further evaluation with MRI was recommended. Chest PA and lateral, 11th of January 2015, revealed persistent opacity the  in the right middle lobe suggestive of subsegmental atelectasis. CT of thorax was suggested, preferably with contrast, to exclude partially obstructing central lesion. There is no evidence of congestive heart failure according to radiologist. CT of chest with IV contrast, 15th of January 2015, showed left lower lobe airspace consolidation and atelectasis. Abrupt cut-off of the posterior basal segment of the left lower lobe  bronchus was identified, which may indicate underlying mucus plugging. Endobronchial lesion cannot be excluded. Radiographic follow-up was recommended. If this does not  resolve, then further investigation with bronchoscopy would be advised. Subsegmental atelectasis involves the right middle lobe and right lower lobe. No endobronchial lesion identified on the right. The trachea is patent, and there is no central obstructing lesion identified. The results were called to ordering clinician or representative by radiologist. Ultrasound of kidneys, bilateral, 16th of  January 2015, due to renal failure was negative for hydronephrosis. A 9 mm nonspecific complex cystic lesion in the right mid pole was noted. If followup was clinically indicated, recommend 78-month followup prior to CT or MRI to exclude interval enlargement. This recommendation follows ACR consensus guidelines according to radiologist.  The patient was admitted to the hospital for further evaluation. She was assessed by Dr. Sharlotte Alamo, who saw the patient in consultation the next day on the 12th of January 2015, and felt that very likely the patient needs to have debridement of infected tissue of the left foot. He also discussed possibility of removing additional bone. From the appearance of the left foot, this may need to be carried up close to the level of the ankle according to Dr. Cleda Mccreedy. Procedure was performed on the 13th of January 2015. Dr. Cleda Mccreedy, however, felt after procedure that it was just abscess of the left foot. Heavy purulence as well as necrotic tissue was removed; however, no significant bone involvement was noted according to him. The patient had wound cultures taken, which grew methicillin-sensitive Staphylococcus aureus, sensitive to all antibiotics but resistant to erythromycin. The patient's wound was reassessed by Dr. Cleda Mccreedy today on the 16th of January 2015, who felt that the patient is stable to be discharged home with home health wound care and saline gauze packing to left foot wound every second day. This was communicated to the patient as well as her family, and the patient was prepared to be discharged. However, she was noted to be somewhat hypoxic. She had x-ray of her chest done on the 15th of January 2015, which was concerning for atelectasis and questionable endobronchial lesion. Per radiologist recommendations, we got a CT scan of her chest with contrast, which showed left lower lobe airspace consolidation as well as atelectasis, possibly related to mucus plugging, but  endobronchial lesion could not be excluded, radiographic followup was recommended. However, on the right side, subsegmental atelectasis involving right middle lobe as well as right lower lobe was noted, with no endobronchial lesion identified. We initiated the patient on Mucinex and incentive spirometry as well as nebulizers, with which the patient's oxygenation significantly improved. However, because of IV dye as well as quite low blood pressure postoperatively, the patient developed acute renal failure. On the 16th of January 2015, she was noted to have creatinine level of 1.75 and even with IV fluid administration, the patient's creatinine remained high. We are going to advance her IV fluids, and we will follow the patient's urine output and if her kidney function improves, we would hold her HCTZ with lisinopril as well as colchicine and reassess her kidney function as outpatient. If, however, kidney function does not improve significantly, she will likely stay here in the hospital for another day until her kidney function improves. We had an ultrasound of her kidneys done, which showed no hydronephrosis.   In regards to her chronic medical problems such as hypertension, the patient is to continue her outpatient medications but HCTZ, lisinopril.   On the day of discharge, 16th of January 2015, the patient's vital signs are stable with temperature of 98.4. Pulse was 81. Respiratory rate was  18, blood pressure 100/69. Saturation was 93% on room air at rest.   TIME SPENT: 40 minutes.   ____________________________ Theodoro Grist, MD rv:jcm D: 08/22/2013 15:44:23 ET T: 08/22/2013 16:38:06 ET JOB#: [id number]  cc: Theodoro Grist, MD, <Dictator> [facility] Sharlotte Alamo, [place]  [place] MD ELECTRONICALLY SIGNED 08/29/2013 14:22

## 2014-11-28 NOTE — Consult Note (Signed)
Brief Consult Note: Diagnosis: High Blood Pressure (Hypertension), COPD, PVD.   Patient was seen by consultant.   Consult note dictated.   Recommend to proceed with surgery or procedure.   Orders entered.   Comments: 58y/oF with PMH of PVD of left leg status post multiple procedure- recent forefoot amputation 2 weeks ago status post wound vac sent in from vascular office for worsening/non healing leg wound and possible infec requiring repeat debridement Medical consult for medical management  * Nonhealing left forefoot amputation wound- foul smelling discharge wound cultures ordered, IV avnc and zosyn further debridement and management per vascular team medically stable for procedure follow-up labs  * HTN- BP normal for now, hold norvasc and restart after surgery  * COPD- stable, no need for systemic steroids, continue inh  * Smoking- none since 2 weeks as at rehab.  Electronic Signatures: Gladstone Lighter (MD)  (Signed 14-Oct-15 16:25)  Authored: Brief Consult Note   Last Updated: 14-Oct-15 16:25 by Gladstone Lighter (MD)

## 2014-11-28 NOTE — Op Note (Signed)
PATIENT NAME:  Natalie [name], Natalie [name] MR#:  [id number] DATE OF BIRTH:  1955-12-13  DATE OF PROCEDURE:  05/07/2014  PREOPERATIVE DIAGNOSES:  1.  Gangrene of the left foot.  2.  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  3.  Hypertension.   POSTOPERATIVE DIAGNOSES: 1.  Gangrene of the left foot.  2.  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  3.  Hypertension.   PROCEDURE:  Chopart amputation, left foot, with VAC dressing placement.   SURGEON: Algernon Huxley, MD   ANESTHESIA: General.   ESTIMATED BLOOD LOSS: 25 mL.   INDICATION FOR PROCEDURE: This is a 59 year old African American female with multiple comorbidities. She has advanced atherosclerotic peripheral vascular disease. She has already undergone aortobifemoral bypass about 2 months ago. She has irreversible ischemia and damage to the forefoot on the left. This extends laterally to beyond the mid foot and into the proximal foot. She has already had fifth digital amputation previously. She now has nonviable left foot. She is being brought in for amputation either at the transmetatarsal level or midfoot level to try to remove her gangrenous tissue and get her wound healed. Risks and benefits were discussed. Informed consent was obtained.   DESCRIPTION OF PROCEDURE: The patient is brought to the operative suite. After an adequate level of general anesthesia obtained, the left lower extremity was sterilely prepped and draped and a sterile surgical field was created. The large open wound was identified in the left foot. I excised out this open wound very close to the wound edge on the top and lateral portion of the foot. I tried to leave as much posterior tissue as possible that was viable, and toward the great toe we were able to come down a little more distally as there was more viable tissue. I initially dissected out and  transected the mid first metatarsal, and then dissected out the second, third, fourth and fifth metatarsals and took these in their entirety. At this point, we had removed what appeared to be the nonviable tissue; however, there was not enough wound to get any coverage over the tarsal bones in the first metatarsal. I then took the first metatarsal back in its entirety and removed it completely and had to shave with the TPS saw each of the tarsal bones back a centimeter to a 1.5 cm and try to bevel this in a fashion that could allow some wound closure. Medially, we were able to close the wound with a series of interrupted figure of eight 2-0 Vicryl sutures and get tissue coverage over the tarsal bones laterally. There was a small amount of exposed bone remaining, after trying to approximate the tissues, but all tissue did appear viable and healthy. At this point, I cut a VAC sponge to fit the wound. At the longest dimension it was 15 cm in length, and at the widest dimension it was as much as 7 cm in length; however, this was somewhat oblong in appearance and almost egg shaped, but the VAC sponge was cut to fit the wound. The wound was irrigated copiously prior to placement of the wound VAC. The VAC sponge was placed and the strips of Ioban were used to gain an occlusive seal. Suction tubing was connected and occlusive seal was obtained. The patient was awakened from anesthesia and then taken to the recovery room in stable condition having tolerated the procedure well.   ____________________________ Algernon Huxley,  MD jsd:TT D: 05/07/2014 15:23:51 ET T: 05/07/2014 15:47:36 ET JOB#: [id number]  cc: Algernon Huxley, MD, <Dictator> Algernon Huxley MD ELECTRONICALLY SIGNED 05/19/2014 10:22

## 2014-11-28 NOTE — Op Note (Signed)
PATIENT NAME:  Natalie [name], Natalie [name] MR#:  [id number] DATE OF BIRTH:  Jan 15, 1956  DATE OF PROCEDURE:  04/15/2014  PREOPERATIVE DIAGNOSES:  1. Open wounds with infection left groin and left thigh.  2. Peripheral arterial disease with gangrene left lower extremity  3. Hypertension.   4. Status post aortobifemoral bypass.   POSTOPERATIVE DIAGNOSES: 1. Open wounds with infection left groin and left thigh.  2. Peripheral arterial disease with gangrene left lower extremity  3. Hypertension.   4. Status post aortobifemoral bypass.   PROCEDURES:  1. Irrigation and debridement of skin, soft tissue, and muscle for about 45 sq cm left groin with vacuum assisted closure dressing placement.  2. Irrigation and debridement of  wound of left thigh of skin, soft tissue, and muscle over about 15 sq cm with  vacuum assisted closure dressing placement.   SURGEON: Leotis Pain, MD.   ANESTHESIA:  General.    ESTIMATED BLOOD LOSS: Minimal.   INDICATION FOR PROCEDURE:  This is a 59 year old female who is brought back to the operating room for evaluation of her wound, change of her VAC and likely further debridement of any nonviable tissue both her left groin and left thigh.  She was brought to the operating room Saturday for the initial VAC placements and debridement of her wound. Risks and benefits were discussed. Informed consent was obtained.   DESCRIPTION OF PROCEDURE: The patient was brought to the operative suite. After an adequate level of general anesthesia was obtained the existing VACs were removed. Both wounds left thigh and lower abdomen were all sterilely prepped with Betadine. A sterile surgical field was created. I used approximately 3 liters of pulse lavage saline to irrigate both wounds out and evaluate the wound bed. There was no graft exposed. There was some necrotic fat and some fibrinous exudate on the skin and soft tissue and muscle on the left groin wound. The wound length was about 8-9 cm and at  its widest area was about 5-6 cm, but was more narrow at the top and the bottom of the wound. I debrided all nonviable tissue again, which included some fibrinous exudate on the skin, soft tissue, and muscle and a mild amount of dead fat from the lower abdominal wall and the lateral side of the wound. When all the tissue appeared reasonably healthy and viable and there was a pretty good granulation bed, I took a medium VAC sponge cut to fit this and tucked it into the wound.   I then turned my attention to the debridement of the left thigh wound. Again there was a mild to moderate amount of fibrinous exudate and fat necrosis in the adipose tissue, there was some fibrinous exudate on the muscle that was debrided sharply. Both the groin wound and the thigh wound sharp excisional debridements were performed with Metzenbaum scissors. There was also subcutaneous tissue with fibrinous exudate that was debrided sharply.  The external part of the wound was only about 3 cm in diameter, however, there was a good centimeter or two of tracking circumferentially around the wound making it a bit larger under the wound and this was probably 2-3 cm in depth down to her thigh muscle. The groin wound was also about 3-4 cm in depth. A piece of VAC sponge was then cut and tucked into the wound. Benzoin was used and strips of Ioban were used to gain an occlusive seal over the sponge and suction tubing.  The suction tubings were connected to a Y connector and  connected to a suction box with a good occlusive seal achieved with both wounds.   The patient was then awakened from anesthesia and taken to the recovery room in stable condition having tolerated the procedure well.    ____________________________ Algernon Huxley, MD jsd:bu D: 04/15/2014 16:10:01 ET T: 04/15/2014 17:02:02 ET JOB#: [id number]  cc: Algernon Huxley, MD, <Dictator> Algernon Huxley MD ELECTRONICALLY SIGNED 04/20/2014 15:06

## 2014-11-28 NOTE — Consult Note (Signed)
PATIENT NAME:  Natalie [name], Natalie [name] MR#:  [id number] DATE OF BIRTH:  Aug 14, 1955  DATE OF CONSULTATION:  08/18/2013  REFERRING PHYSICIAN:   CONSULTING PHYSICIAN:  Sharlotte Alamo, DPM  REASON FOR CONSULTATION:  [name] is a 59 year old female with a history of peripheral vascular disease, who recently underwent revascularization at the end of this past year. Subsequently, she developed some gangrenous changes in her left fifth toe which was amputated on New Year's Eve. Postsurgically, the patient has developed some infection in her left foot with some drainage from the foot, inability to put weight on the foot and presented to the Emergency Department where she was admitted for possible osteomyelitis.   PAST MEDICAL HISTORY:   1.  Hypertension.  2.  Hyperlipidemia.  3.  Peripheral vascular disease  4.  COPD.  5.  Gout.    PAST SURGICAL HISTORY:   1.  Angiogram with left leg stent placement.  2.  Left fifth toe amputation with partial ray resection.   HOME MEDICATIONS: 1.  Norco 5/325 mg p.o. q.4h. p.r.n. pain.  2.  Allopurinol 100 mg p.o. daily.  3.  Norvasc 10 mg p.o. daily.  4.  Benazepril/hydrochlorothiazide 20 mg/12.5 mg p.o. daily.  5.  Colcrys 0.06 mg p.o. daily.  6.  Flonase 50 mcg inhalation 2 sprays once a day in each nostril.  7.  Lovastatin 20 mg p.o. at bedtime.  8.  Omeprazole 40 mg in the morning.  9.  ProAir inhaler 2 puffs 4 times a day as needed for shortness of breath.  10.  Qvar 80 mcg 2 puffs inhaled.  11.  Spiriva 1 capsule inhalation daily.   ALLERGIES: No known drug allergies.    SOCIAL HISTORY: Lives at home. Unemployed. Smokes about 1/2 pack of cigarettes per day. Denies alcohol.   FAMILY HISTORY: Cancer, kidney disease.   REVIEW OF SYSTEMS: Significant swelling and redness in the left foot with some drainage from the surgical site. Denies any numbness or paresthesias. Denies any specific fever or chills. Does have some generalized fatigue and weakness. Denies  any dysurias or incontinence. No cough or shortness of breath. Denies chest pain.   PHYSICAL EXAMINATION: VASCULAR: DP and PT pulses are barely palpable in the left foot. Capillary filling time appears delayed.  NEUROLOGICAL: Epicritic sensations appear grossly intact.  INTEGUMENT: There is some significant erythema and edema in the left foot with dehiscence and drainage from the amputation site at the left fifth toe. A subcutaneous fluid pocket is noted extending proximally onto the lateral and dorsal midfoot. Some hemosanguineous  drainage from the proximal dorsal aspect.  MUSCULOSKELETAL: Very limited and guarded range of motion secondary to severe pain. Unable to allow any significant palpation. The patient also was unable to have her MRI performed secondary to pain in the left foot.   X-RAYS: Multiple views of the left foot reveal postsurgical changes with amputation of the fifth toe as well as a portion of the fifth metatarsal head. Otherwise does not specifically appear to be erosive changes in the fifth metatarsal. Did not clearly appear to have obvious gas in the tissues.   IMPRESSION: 1.  Peripheral vascular disease.  2.  Cellulitis with abscess and possible osteomyelitis, left fifth metatarsal.   PLAN: I discussed with the patient and her family the need to debride the infected tissue out of her left foot. I discussed possibly removing some additional bone. From the appearance of the left foot, this may need to be carried up close to the  level of the ankle. This will need to be left open for drainage. We will obtain consent for the debridement. She will be n.p.o. after midnight tonight. Plan for surgery hopefully tomorrow at lunch, if not in the evening.   ____________________________ Sharlotte Alamo, DPM tc:cs D: 08/18/2013 19:04:16 ET T: 08/18/2013 20:23:06 ET JOB#: [id number]  cc: Sharlotte Alamo, DPM, <Dictator> Algernon Huxley, MD Khyra Viscuso DPM ELECTRONICALLY SIGNED 09/30/2013 9:38

## 2014-11-28 NOTE — Discharge Summary (Signed)
PATIENT NAME:  [name], Natalie MR#:  [id number] DATE OF BIRTH:  04-22-1956  DATE OF ADMISSION:  08/17/2013 DATE OF DISCHARGE:  08/23/2013  Addendum  The patient was not discharged as previously was thought on 08/22/2013 due to acute renal failure.  The patient was noted to have acute renal failure with creatinine level of 1.82 on 08/21/2013.  It was felt to be due to IV dye as well as Ace inhibitor as well as possibly some element of perioperative dehydration.  The patient was hydrated, however her kidney function did not improve significantly the same day.  She was kept overnight until 08/23/2013 for more hydration.  She was given IV fluids and by 08/23/2013 the patient's kidney function improved to 1.51.  It was felt that the patient should drink plenty of fluids and follow up with her primary care physician for recommendations and follow up on her kidney function in the next few days after discharge.  By the day of discharge the patient was comfortable, in no significant discomfort.  She is being discharged in stable condition with the above-mentioned medications and follow-up.    Her vital signs on the day of discharge:  Temperature was 98.1, pulse was 88, respirations were 18, blood pressure 134/83, saturation was 91% to 92% on room air at rest.    MEDICATIONS:  The patient's medication list is as follows.  The patient is to resume:  1.  QVAR 2 puffs twice daily.  2.  ProAir HFA 2 puffs 4 times daily as needed.  3.  Spiriva 1 capsule inhalation daily.  4.  Fluticasone two sprays once daily to each nostril.  5.  Amlodipine 10 mg by mouth daily.  6.  Lovastatin 20 mg by mouth daily.  7.  Allopurinol 100 mg by mouth daily.  8.  Omeprazole 40 mg by mouth daily.  9.  Norco versus Percocet 325/5 mg 1 tablet every four hours as needed.  10.  Amoxicillin clavulanate 875/125 mg 1 tablet every 12 hours for 14 days.  11.  Guaifenesin 600 mg twice daily.  12.  Docusate sodium 100 mg by mouth twice  daily.  13.  Nicotine transdermal film 14 mg topically daily.   The patient was advised not to take Colcrys or HCTZ/ lisinopril until recommended by primary care physician.   She is to follow up with her primary physician in the next 1 to 2 days after discharge.   She is to follow up with Dr. Caryl Comes in one week after discharge.  The patient was advised to continue home health, physical therapy as well as nurse and wound care at home.  She will have her left foot wound dry, saline gauze packing every other day, changed by visiting nurse.   TIME SPENT:  40 minutes.    ____________________________ Natalie Grist, MD rv:ea D: 08/23/2013 19:23:13 ET T: 08/23/2013 23:11:26 ET JOB#: [id number]  cc: Natalie Grist, MD, <Dictator> [facility] Dvon Jiles MD ELECTRONICALLY SIGNED 08/29/2013 14:26

## 2014-11-28 NOTE — Discharge Summary (Signed)
PATIENT NAME:  Natalie [name], Natalie [name] MR#:  [id number] DATE OF BIRTH:  May 15, 1956  DATE OF ADMISSION:  05/20/2014 DATE OF DISCHARGE:  05/28/2014  ADMITTING DIAGNOSES: 1.  Gangrene, left foot.  2.  Abscess, left thigh. 3.  Peripheral arterial disease with gangrene, left foot, and infection.  4.  Hypertension.  DISCHARGE DIAGNOSES:  1.  Gangrene, left foot.  2.  Abscess, left thigh. 3.  Peripheral arterial disease with gangrene, left foot, and infection.  4.  Hypertension.  PROCEDURES PERFORMED WHILE HOSPITALIZED: Left above-knee amputation and VAC placement to left thigh with abscess debridement.   BRIEF HISTORY: This is a 59 year old African American female with extensive peripheral vascular disease. She has undergone previous percutaneous revascularizations and an aortobifemoral bypass in the past to try to restore her flow. She had gangrenous changes to her left foot with demarcation beyond the mid foot. We attempted debridement and then a [place] amputation, but this was not healing, and she no longer had a good weight-bearing surface on her foot and required major amputation. She also had an abscess of her left thigh and she was brought in for admission for antibiotics and definitive major amputation. We discussed options for below-knee amputation and above-knee amputation and she preferred above knee as this was more definitive.   HOSPITAL COURSE: The patient was admitted, started on IV antibiotics and an internal medicine consulted was held to get her prepared for surgery. On hospital day number 2, she was taken to the operating room. For full details of that, please see the dictated operative summary. Her pain control was difficult. She had been on chronic narcotics prior to her procedure, but with a PCA and weaning to oral medications over several days we were able to get adequate pain control. She began working with physical therapy who still recommended she go to a skilled nursing facility and  not yet to home and by hospital day number 6 she was afebrile with vital signs stable. She did require 1 unit of blood transfusion for chronic anemia and then acute blood loss anemia from surgery dropping her hemoglobin down to below 7. It was stable at discharge at about 8.5. Her renal function was stable. She was doing well. Her cultures had shown multiple bacteria, and we obtained an infectious disease consult who recommended meropenem and doxycycline, which she will be continued on at discharge.  DISCHARGE MEDICATIONS: Include: 1.  Q-var 80 mcg 2 puffs inhaled twice a day.  2.  Spiriva 18 mcg inhaled daily.  3.  Fluticasone 2 sprays once a day in the morning.  4.  Amlodipine 10 mg daily.  5.  Lovastatin 20 mg daily.  6.  Allopurinol 100 mg in the morning.  7.  Aspirin 81 mg daily.  8.  Prilosec 10 mg daily.  9.  Nicotine transdermal patch 21 mcg daily.  10.  Colace 200 mg b.i.d.  11.  Enoxaparin 40 mg daily.  12.  Percocet 7.5/325 every 4 hours as needed for pain.  13.  Fentanyl 25 mcg transdermal patch.  14.  Doxycycline 100 mg b.i.d. for 14 days.  15.  Ferrous sulfate 325 mg b.i.d.  16.  Meropenem 1 gram IV q. 8 hours for 2 weeks. A PICC line was placed for administration of intravenous antibiotics at discharge.   DISCHARGE DIET: Regular.  DISCHARGE ACTIVITY: As tolerated.  DISCHARGE FOLLOWUP: A return appointment will be in about 2 to 3 weeks for wound check and likely staple removal.  ____________________________ Algernon Huxley,  MD jsd:sb D: 05/28/2014 14:50:11 ET T: 05/28/2014 15:43:15 ET JOB#: [id number]  cc: Algernon Huxley, MD, <Dictator> Algernon Huxley MD ELECTRONICALLY SIGNED 06/04/2014 15:26

## 2014-11-28 NOTE — Consult Note (Signed)
PATIENT NAME:  Natalie [name], Natalie [name] MR#:  [id number] DATE OF BIRTH:  Mar 01, 1956  DATE OF CONSULTATION:  05/20/2014  REFERRING PHYSICIAN:   CONSULTING PHYSICIAN:  Gladstone Lighter, MD  ADMITTING PHYSICIAN: Dr. Delana Meyer.   REASON FOR CONSULTATION: Medical management.   PRIMARY CARE PHYSICIAN: At [facility]   BRIEF HISTORY: [name] is a 59 year old African American female with past medical history significant for hypertension, non-O2 dependent COPD, tobacco abuse disorder, peripheral vascular disease with significant occlusions in the left lower extremity for which she required aortofemoral bypass and toe amputation, and recently had her foot amputation of her left foot done about 2 weeks ago by Dr. Lucky Cowboy here. The patient went to [place] [facility]; however, she had some issues with the wound VAC not draining out. Her foot was very swollen. She went to see Dr. Delana Meyer in the office today who felt that it needs more debridement. Her open wound of the foot of the left leg appears extremely foul-smelling with serosanguineous discharge. Her labs are pending at this time. She was placed on empiric antibiotics and vascular plans to debride the wound. Medical consult requested for management of her hypertension, COPD prior to procedure.   PAST MEDICAL HISTORY: 1.  Peripheral vascular disease.  2.  Hypertension.  3.  COPD.  4.  Tobacco use disorder.  5.  History of gout.   PAST SURGICAL HISTORY:  1.  Aortofemoral bypass on the left leg, toe amputations, and foot amputation of the left foot.  2.  Tubal ligation.   ALLERGIES TO MEDICATIONS: No known drug allergies.   CURRENT HOME MEDICATIONS:  1.  Tylox 7.5/325 mg 1 tablet q. 4 hours p.r.n. for moderate pain.  2.  Allopurinol 100 mg p.o. daily.  3.  Norvasc 10 mg p.o. daily.  4.  Aspirin 81 mg p.o. daily.  5.  Colace 100 mg 2 capsules twice a day.  6.  Lovenox subcutaneous every 24 hours.   7.  Fentanyl 25 mcg  transdermal film every 72 hours.  8.  Flonase 50 mcg nasal spray 2 sprays each nostril once a day.  9.  Lovastatin 20 mg p.o. daily.  10.   Nicotine patch.  11.   Prilosec 10 mg daily.  12.   QVAR  80 mcg 2 puffs inhaled twice a day.  13.   Spiriva 80 mcg inhalation capsule daily.   SOCIAL HISTORY: A resident of [place] since her last procedure 2 weeks ago. Prior to that, she was living at home by herself  and was very functional. Smokes about 1/2 pack per day. No alcohol abuse.   FAMILY HISTORY: Significant for heart disease in mother and also hypertension in father.  REVIEW OF SYSTEMS:  CONSTITUTIONAL: No fever, fatigue, or weakness.  EYES: No blurred vision, double vision, inflammation or glaucoma.  ENT: No tinnitus, ear pain, hearing loss, epistaxis or discharge.  RESPIRATORY: No cough, wheeze, hemoptysis or COPD.  CARDIOVASCULAR: No chest pain, orthopnea, edema, arrhythmia, palpitations or syncope.   GASTROINTESTINAL: No nausea, vomiting, diarrhea, abdominal pain, hematemesis, or melena.  GENITOURINARY: No dysuria, hematuria, renal calculus, frequency, or incontinence.  ENDOCRINE: No polyuria, nocturia, thyroid problems, heat or cold intolerance.  HEMATOLOGY: No anemia, easy bruising or bleeding.  SKIN: Positive for ulcer of the foot at the amputation site and also one on the back of the leg in the lower one-third of the leg.  MUSCULOSKELETAL: Leg pain. No other pains. No arthritis. Positive for gout.  NEUROLOGIC: No numbness, weakness,  CVA, TIA or seizures.  PSYCHOLOGICAL: No anxiety, insomnia, depression.   PHYSICAL EXAMINATION: VITAL SIGNS: Temperature 98.1 degrees Fahrenheit, pulse 123, respirations 19, blood pressure 103/72, pulse oximetry 99% on room air.  GENERAL: Heavily built, well-nourished female sitting in bed in severe distress secondary to her leg pain.  HEENT: Normocephalic, atraumatic. Pupils equal, round, reacting to light. Anicteric sclerae.  Extraocular movements intact. Oropharynx is clear without mass or exudates.  NECK: Supple. No thyromegaly, JVD or carotid bruits. No lymphadenopathy.  LUNGS: Moving air bilaterally. No wheezes or crackles. No use of accessory muscles for breathing.  CARDIOVASCULAR: S1, S2, regular rate and rhythm. No murmurs, rubs, or gallops.  ABDOMEN: Obese, soft, nontender, nondistended. No hepatosplenomegaly. Normal bowel sounds.  EXTREMITIES: Right lower extremity is normal with feeble dorsalis pedis pulses; however, left foot at the amputation site of the foot right beyond the ankle is a huge ulcer with areas of serosanguineous discharge, foul-smelling, noted. There is also a 5 x 5 ulcer with slough noted right around the Achilles tendon on the posterior part of the left leg.  LYMPHATIC: No cervical lymphadenopathy.  NEUROLOGIC: Cranial nerves intact. No focal motor or sensory deficits.  PSYCHOLOGICAL: The patient is awake, alert, oriented x 3.   LABORATORY DATA: Labs from today are pending, but her creatinine is normal at 0.92.   RECOMMENDATIONS: A 59 year old female with history of peripheral vascular disease status post multiple procedures on her left leg, recent foot amputated 2 weeks ago with the wound VAC, sent in from vascular office for worsening and nonhealing wound and possible infection requiring repeat debridement. Medical consult requested for medical management.  1.  Nonhealing left forefoot amputation wound with foul-smelling discharge. Wound cultures ordered. Started on IV vancomycin and Zosyn. Further debridement and  management per vascular team. The patient is medically stable for procedure. Follow up her labs.   2.  Hypertension. Blood pressure is low-normal. Could be secondary from pain medications. Hold Norvasc and may restart it after surgery.  3.  Chronic obstructive pulmonary disease, appears stable. Continue her inhalers. No need for systemic steroids.  4.  Smoking. The patient has not  been smoking for 2 weeks almost since she has been at the rehab. Will continue on nicotine patch.   CODE STATUS: Full code.   TIME SPENT CONSULTATION: 50 minutes.    ____________________________ Gladstone Lighter, MD rk:at D: 05/20/2014 16:47:04 ET T: 05/20/2014 18:45:18 ET JOB#: [id number]  cc: Gladstone Lighter, MD, <Dictator> [facility] Katha Cabal, MD  Gladstone Lighter MD ELECTRONICALLY SIGNED 05/21/2014 17:42

## 2014-11-28 NOTE — Consult Note (Signed)
PATIENT NAME:  Natalie [name], Natalie [name] MR#:  [id number] DATE OF BIRTH:  07/01/1956  DATE OF CONSULTATION:  01/08/2014  CONSULTING PHYSICIAN:  Leotis Pain, MD  REASON FOR CONSULTATION:  Left lower extremity weakness.  HISTORY OF PRESENT ILLNESS: A 59 year old female with hypertension, diabetes, history of lower extremity stents, presents for vascular procedure; preoperative diagnosis peripheral arterial disease with claudication, bilateral lower extremity pain at rest. The patient was found to have thrombus of previous left EIA stent.  The patient also has previous toe amputation. The patient is status post ultrasound guidance for vascular access to bilateral femoral arteries, status post left common femoral artery. Post procedure, it was noted that the patient was having weakness in the left lower extremity. No speech abnormalities. No visual changes. On my examination, the patient had some cramping on the left lower extremity.   PAST MEDICAL HISTORY: Hypertension, history of stroke.   LABORATORY DATA: Workup reviewed. Imaging: Not done.   REVIEW OF SYSTEMS: The patient is alert, awake, oriented to time, place, location; told me her name, date and time.   NEUROLOGIC: Speech is fluent. Extraocular movements are intact. Pupils are round and reactive, face symmetrically. Facial sensation intact. Facial motor is intact. Tongue is midline. Uvula elevates symmetrically. Shoulder shrug intact. Motor strength 5/5 bilateral upper extremities. Lower extremities slightly diminished; it is 4/5 with a drift. Reflex is diminished. Sensation intact to light touch and temperature. No facial droop noted.  A 59 year old female with hypertension, history of stroke, previous toe amputation, peripheral arterial disease, presenting to vascular lab. Post vascular procedure found to have weakness in the left lower extremity. Upon further examination and questioning, the patient is having cramps in the left lower extremity,  which are massaged by her son, and symptoms have significantly improved post that.  Agree with antiplatelet therapy as the patient is a vasculopath. Do not think this is acute stroke or ischemia, as symptoms resolved fairly rapidly and it appears to be musculoskeletal in nature. Would recommend muscle relaxant, such as small dose baclofen if needed.  Thank you.  It was a pleasure seeing this patient.  This case discussed with primary team.     ____________________________ Leotis Pain, MD yz:ce D: 01/08/2014 17:37:27 ET T: 01/08/2014 18:52:16 ET JOB#: [id number]  cc: Leotis Pain, MD, <Dictator> Leotis Pain MD ELECTRONICALLY SIGNED 01/22/2014 17:20

## 2014-11-28 NOTE — H&P (Signed)
PATIENT NAME:  Natalie [name], COULSTON MR#:  [id number] DATE OF BIRTH:  May 25, 1956  DATE OF ADMISSION:  08/17/2013  ADMITTING PHYSICIAN: Gladstone Lighter, M.D.   PRIMARY CARE PHYSICIAN: At [facility].   CHIEF COMPLAINT: Swelling of left leg and tenderness and discharge from a recent surgical site.    HISTORY OF PRESENT ILLNESS: [name] is a 59 year old African American female with past medical history significant for hypertension, hyperlipidemia, COPD and peripheral vascular disease who is having left leg claudication symptoms, and she had an angiogram done and stent placed a month ago and had left toe amputated about 10 days ago by Dr. Lucky Cowboy. The patient was not on any antibiotics postsurgery. She states over the last 3 to 4 days, she has noticed extensive swelling of her left leg, and it is warm and tender, erythematous, and at the surgical site, she has been having some discharge coming out and blisters also found on her left foot lateral side. Denies any fevers but has been having some chills and weakness. Unable to bear weight on the foot and presents to the ER. She had an x-ray done up of the foot here which shows possible changes for osteomyelitis, so she is being admitted for the same.   PAST MEDICAL HISTORY:  1. Hypertension.  2. Hyperlipidemia.  3. Peripheral vascular disease.  4. COPD.    PAST SURGICAL HISTORY:  1. Angiogram and left leg stent placement.  2. Left fifth toe amputation done 10 days ago.   ALLERGIES TO MEDICATIONS: No known drug allergies.   CURRENT HOME MEDICATIONS:  1. Norco 5/325 mg p.o. q.4 hours p.r.n. for pain.  2. Allopurinol 100 mg p.o. daily.  3. Norvasc 10 mg p.o. daily.  4. Benazepril/HCTZ 20 mg/12.5 daily p.o. daily.  5. Colcrys 0.06 mg p.o. daily.  6. Flonase 50 mcg inhalation 2 sprays once a day each nostril.  7. Lovastatin 20 mg p.o. at bedtime.  8. Omeprazole 40 mg in the morning.  9. ProAir inhaler 2 puffs 4 times a day as needed for  shortness of breath.  10. Qvar 80 mcg 2 puffs inhaled bid 11. Spiriva 1 capsule inhalation daily.    SOCIAL HISTORY: Lives at home by herself. Very functional at baseline. Currently not working. Smokes about 1/2 pack per day. Denies any alcohol use.   FAMILY HISTORY: Mom with kidney problems and dad with brain tumor.   REVIEW OF SYSTEMS:  CONSTITUTIONAL: No fever. Positive for fatigue and weakness.  EYES: No blurry vision, double vision, inflammation, glaucoma or cataracts.  ENT: No tinnitus, ear pain, hearing loss, epistaxis or discharge.  RESPIRATORY: No cough, wheeze, hemoptysis. Positive for COPD.   CARDIOVASCULAR: No chest pain, orthopnea, edema, arrhythmia, palpitations or syncope.  GASTROINTESTINAL: No nausea, vomiting, diarrhea, abdominal pain, hematemesis or melena.  GENITOURINARY: No dysuria, hematuria, renal calculus, frequency or incontinence.  ENDOCRINE: No polyuria, nocturia, thyroid problems, heat or cold intolerance.  HEMATOLOGY: No anemia, easy bruising or bleeding.  SKIN: Positive for ulcer at the fifth toe from postsurgery and blistering and erythematous changes on the left foot lateral side.  MUSCULOSKELETAL: Positive for gout. No arthritis.   NEUROLOGIC: No numbness, weakness, CVA, TIA or seizures.  PSYCHOLOGICAL: No anxiety, insomnia or depression.   PHYSICAL EXAMINATION:  VITAL SIGNS: Temperature 98.2 degrees Fahrenheit, pulse 96, respirations 22, blood pressure 137/76, pulse ox 97% on room air.  GENERAL: Heavily built, well-nourished female sitting in bed, not in any acute distress.  HEENT: Normocephalic, atraumatic. Pupils equal, round, reacting  to light. Anicteric sclerae. Extraocular movements intact. Oropharynx is clear without erythema, mass or exudates.  NECK: Supple. No thyromegaly, JVD or carotid bruits. No lymphadenopathy.  LUNGS: Moving air bilaterally. No wheeze or crackles. No use of accessory muscles for breathing.  CARDIOVASCULAR: S1, S2, regular  rate and rhythm. No murmurs, rubs or gallops.  ABDOMEN: Obese, soft, nontender, nondistended. No hepatosplenomegaly. Normal bowel sounds.  EXTREMITIES: Right foot is normal with good dorsalis pedis pulses. No edema noted. Left foot is swollen, showing 3+ pedal edema which is erythematous and left fifth toe postsurgical changes with sutures in place, yellowish discharge and superficial blisters on the lateral side. There is also 1+ edema and tenderness below the knee on the leg.  SKIN: No other changes.  LYMPHATICS: No cervical lymphadenopathy.  NEUROLOGIC: Cranial nerves intact. No focal motor or sensory deficits.  PSYCHOLOGICAL: The patient is awake, alert, oriented x 3.   LABORATORY DATA: WBC 10.7, hemoglobin 11.6, hematocrit 34.4, platelets count 476.   Sodium 134, potassium 3.8, chloride 101, bicarb 29, BUN 11, creatinine 1.1, glucose 100 and calcium of 10.0.   ALT 22, AST 24, alk phos 124, total bili 0.3 and albumin of 3.0.   X-ray of the left foot showing status post amputation. Erosive changes in the distal aspect of the fifth metatarsal highly suspicious for osteomyelitis. For further evaluation, MRI could be performed.   ASSESSMENT AND PLAN: This is a 59 year old female with hypertension, diabetes, peripheral vascular disease, chronic obstructive pulmonary disease who had recently left fifth toe amputation done for peripheral vascular disease, presents with changes indicative of possible osteomyelitis.  1. Left toe osteomyelitis, status post amputation recently done by vascular about 10 days ago. Now swelling, discharge and x-ray showing changes of possible osteomyelitis. MRI has been order. Blood cultures have been done. Started on intravenous vancomycin and Zosyn. Vascular has been consulted.  2. Chronic obstructive pulmonary disease: Appears to be stable. Continue home inhalers.   3. Hypertension: Continue home medications.  4. Tobacco use disorder: Counseled for 3 minutes against  smoking. Started on nicotine patch.   CODE STATUS: FULL CODE.   TIME SPENT ON ADMISSION: 50 minutes.    ____________________________ Gladstone Lighter, MD rk:gb D: 08/17/2013 19:37:50 ET T: 08/18/2013 01:49:58 ET JOB#: [id number]  cc: Gladstone Lighter, MD, <Dictator> [place] MD ELECTRONICALLY SIGNED 08/19/2013 15:00

## 2014-11-28 NOTE — Op Note (Signed)
PATIENT NAME:  Natalie [name], Natalie [name] MR#:  [id number] DATE OF BIRTH:  08-19-1955  DATE OF PROCEDURE:  03/25/2014  PREOPERATIVE DIAGNOSES: 1. Aortoiliac occlusion.  2. Atherosclerosis with rest pain and ulceration, left lower extremity and claudication, right lower extremity.  3. Tobacco dependence. 4. History of stroke.  5. Mild chronic kidney disease. 6. Hypertension.  POSTOPERATIVE DIAGNOSES: 1. Aortoiliac occlusion.  2. Atherosclerosis with rest pain and ulceration, left lower extremity and claudication, right lower extremity.  3. Tobacco dependence. 4. History of stroke.  5. Mild chronic kidney disease. 6. Hypertension. 7. Small infrarenal AAA  PROCEDURES: 1. Aorta and left renal artery endarterectomy.  2. Right femoral artery endarterectomy.  3. Aortobifemoral bypass.  CO-SURGEONS:  Leotis Pain, MD and Katha Cabal, MD.  ANESTHESIA: General.   ESTIMATED BLOOD LOSS: 300 mL.   INDICATION FOR PROCEDURE: This is a 59 year old female with extensive peripheral vascular disease and aortoiliac disease. She has had a chronic right iliac occlusion and some claudication. She has an aortic occlusion that has progressed and left iliac disease that has progressed to occlusion. She has had 2 previous aortoiliac interventions. She has ischemic rest pain of the left foot. She has ulcerations on the left leg and nearly intractable pain. We have exhausted our percutaneous options and she is brought in for open surgical repair with a planned aortobifemoral bypass. Risks and benefits were discussed. Informed consent was obtained.   DESCRIPTION OF PROCEDURE: The patient is brought to the operative suite, and after an adequate level of general anesthesia was obtained, she was prepped from the chest to the knees and a sterile surgical field was created. Dr. Delana Meyer and I dissected out the femoral arteries; I dissected out the right and he dissected out the left. These vessels were found to be reasonable  vessels for implantation distally. They were encircled with vessel loops and then packed with wet sponges. We then created a generous midline laparotomy incision. We placed the Omni retractor to help facilitate our exposure, retracted the bowel into the right upper quadrant, then began dissecting out the aorta. The aorta was pulseless all the way up to the renal vein which I identified and protected from harm. The IMV was ligated, divided between silk ties. We dissected out the aorta. There was actually an aneurysmal component in the mid section of the aorta with a small bulbous dilatation to a small to medium size aneurysm. We dissected out the aortic bifurcation and the iliac arteries and stayed immediately on top of the iliac vessels as we create our tunnel to both femoral arteries and tract this with umbilical tape. The aorta was prepared for clamping initially just below the renal arteries. The patient was systemically heparinized. The patient was given 5000 units of intravenous heparin. Her aorta was then clamped just below the renal arteries and the aorta was opened. There was extensive aortic thrombus and, unfortunately, below the level of the renal arteries, the aorta was still occluded and, even with the clamp taken off, there was no inflow. We then had to dissect out the suprarenal aorta. The renal arteries were encircled with vessel loops about 1 cm beyond its origin in each location and the clamp was then placed in a suprarenal location. Our total time of suprarenal clamp was 30 minutes. The patient was given 25 grams of mannitol. We continued endarterectomy on the aorta up into the level to the left renal artery, which was lower. A renal endarterectomy had to be performed as well, and an  eversion endarterectomy was performed on the left renal artery by removing plaque and thrombus from the proximal left renal artery with a Debakey and with a Lahey dissector until there was return of backbleeding. The  right renal artery and backbleeding and endarterectomy was not performed on the right renal artery. We prepared the aorta just below the renals for our proximal endpoint. Due to the aneurysmal bulbous nature of the mid infrarenal aorta, I did not feel an end-to-side anastomosis was appropriate, and we elected to place this in and an end-to-end fashion. We selected a 14 mm diameter x 7 cm in length graft was cut and prepared for anastomosis proximally. A 3-0 Prolene suture was started on the back wall and run around to the front. The vessel was flushed and de-aired, and then flow was restored, and the clamp was placed back in the infrarenal location. The graft was then flushed with heparinized saline and brought out both the right and left groin incisions and brought down to prepare for anastomosis. I started the arteriotomy on the right femoral artery with an 11 blade and extended with Potts scissors. There was some significant plaque, particularly in the more proximal common femoral artery, and I performed an endarterectomy with the elevator to create a nice, healthy vessel. The graft was then cut and beveled to an appropriate length to match the arteriotomy. An anastomosis was created with a running 5-0 Prolene suture. The graft was flushed and de-aired prior to releasing control. On release, there was excellent pulsatile flow. We then turned our attention to the left femoral artery. Left femoral artery was opened. It did not require an endarterectomy on its side, and we cut and beveled the graft to match the arteriotomy, and the left femoral anastomosis was created with a 5-0 Prolene as well. This vessel was flushed and de-aired prior to releasing control. On release, there was excellent pulsatile flow through both graft and into the femoral arteries below. We hooded both anastomoses to the distal common femoral artery just above the bifurcation as both SFAs were continuous with 2-vessel runoff distally and the  profunda were patent as well.   The wounds were irrigated. Surgicel and Evicel topical hemostatic agents were placed. Hemostasis was complete. We closed the retroperitoneum with a 0 Vicryl. The abdominal incision was closed with 2 loop #1 PDS sutures. Subcutaneous tissue was closed with 3-0 Vicryl and the skin was closed with 4-0 Monocryl. The groin incisions were closed with a layer of 2-0 Vicryl, 2 layers of 3-0 Vicryl and 4-0 Monocryl. Dermabond was placed as a dressing on the groin incisions and a dry dressing was placed on the abdominal incision.   The patient was then awakened from anesthesia and extubated, taken to the recovery room in stable condition, having tolerated the procedure well.   ____________________________ Algernon Huxley, MD jsd:NT D: 03/25/2014 14:19:23 ET T: 03/25/2014 16:06:53 ET JOB#: [id number]  cc: Algernon Huxley, MD, <Dictator> Algernon Huxley MD ELECTRONICALLY SIGNED 04/01/2014 10:35

## 2014-11-28 NOTE — Consult Note (Signed)
PATIENT NAME:  Natalie [name], Natalie [name] MR#:  [id number] DATE OF BIRTH:  12-05-1955  DATE OF CONSULTATION:  05/27/2014  REFERRING PHYSICIAN:   CONSULTING PHYSICIAN:  Cheral Marker. Ola Spurr, MD  REQUESTING PHYSICIAN: Dr. Hortencia Pilar.    REASON FOR CONSULTATION: Multidrug-resistant infection of thigh abscess.   HISTORY OF PRESENT ILLNESS: This is a pleasant 59 year old female vasculopath who was admitted October 14 with the cellulitis of her left leg associated with gangrene. She was admitted from vascular surgery clinic. She has a long history of peripheral vascular disease. In August of this year she underwent aortobifemoral bypass. She ended up having debridement of the left groin incision and left thigh wound. She then underwent on October 1 left forefoot amputation and debridement of her left thigh wound. She also has a history of hypertension, hyperlipidemia, COPD. When she was admitted October 14 she was started on IV antibiotics and cultures were obtained. She underwent an AKA on October 16. She also had debridement of her left thigh wound. Since then clinically she stabilized.  She continues to have pain at the site especially on her left thigh wound.   PAST MEDICAL HISTORY:  1.  Peripheral vascular disease as above.  2.  Hypertension.  3.  COPD.   4.  Tobacco abuse.  5.  Gout.   PAST SURGICAL HISTORY:  1.  Aortofemoral bypass left leg in August.    2.  Toe amputations in September as above.   3.  Tubal ligation.   SOCIAL HISTORY: She is a resident at [facility] recently from her prior procedures. Prior to that she was living at home by herself. She smokes daily.  No alcohol abuse.    FAMILY HISTORY: Significant for heart disease and hypertension.   REVIEW OF SYSTEMS: 11 systems reviewed and negative except as per HPI.    ALLERGIES: No known drug allergies.   ANTIBIOTICS SINCE ADMISSION: Include ertapenem which she is currently on since October 15. She was also on Zosyn  October 14-15 and vancomycin October 14-15.   PHYSICAL EXAMINATION:  VITAL SIGNS: Temperature 98.5, pulse 86, blood pressure 112/88, respirations 18, saturation 97% on room air.  GENERAL: She is chronically ill-appearing, sitting in bed.  HEENT: Pupils equal, round, and reactive to light and accommodation. Extraocular movements are intact. Sclerae anicteric. Oropharynx clear.  NECK: Supple.  HEART: Regular.  LUNGS: Clear.  ABDOMEN: Obese, soft, nontender.  EXTREMITIES: She has a left AKA site which is covered with a Wound VAC. She also has on her left lateral thigh an area with a wound covered by a Wound VAC. There is some surrounding induration. In her left groin she also has an open wound which has some yellow exudate on it.  NEUROLOGIC: She is alert and oriented x 3. Grossly nonfocal neurologic exam.   DATA: White blood count on admission was 14.1, it was 12.7 on October 19, hemoglobin 7.5, platelets 769,000. They were 779,000 on admission. Renal function is normal with a creatinine of 0.86. Culture results from October 14 of her left foot grew Klebsiella, Escherichia coli, enterococcus and Stenotrophomonas. Prior culture had also grown Escherichia coli and Stenotrophomonas.  September 5 groin abscess grew Escherichia coli, Klebsiella, enterococcus and Bacteroides.   IMPRESSION: A 59 year old vasculopath status post aortobifemoral bypass with progressive gangrene on her left foot. She is now status post left a.k.a. but has a continued nonhealing left thigh wound which has grown multiple bacteria including multi-drug-resistant extended spectrum beta-lactamase-producing Escherichia coli and resistant Stenotrophomonas and enterococcus. She currently  has the wound covered with a Wound VAC.  She continues to have a low-grade white count, but is afebrile. She has been on ertapenem most recently for the extended spectrum beta-lactamase-producing Escherichia coli.   RECOMMENDATIONS:  1.  Change  ertapenem to meropenem. This will provide coverage for enterococcus as well as the ESBL Escherichia coli and Klebsiella. The Stenotrophomonas is resistant and I have asked the laboratory to send it to [facility] for further testing for ceftazidime, doxycycline, and tigecycline. At this point I would add doxycycline to see if we can get some improvement. Also potentially debridement will help with healing that wound and sometimes we can get away with not  actively treating Stenotrophomonas.  2.  She will likely need a PICC line for IV antibiotics.  3.  Thank you for the consult. I will be glad to follow with you.    ____________________________ Cheral Marker. Ola Spurr, MD dpf:bu D: 05/27/2014 16:04:45 ET T: 05/27/2014 18:20:42 ET JOB#: [id number]  cc: Cheral Marker. Ola Spurr, MD, <Dictator> DAVID Ola Spurr MD ELECTRONICALLY SIGNED 06/02/2014 9:55

## 2014-11-28 NOTE — Op Note (Signed)
PATIENT NAME:  Natalie [name], Natalie [name] MR#:  [id number] DATE OF BIRTH:  [birth year]  DATE OF PROCEDURE:  08/19/2012  PREOPERATIVE DIAGNOSIS: Abscess, left foot, status post amputation.   POSTOPERATIVE DIAGNOSIS: Abscess, left foot, status post amputation.   PROCEDURE: Incision and drainage of abscess of the left foot.   SURGEON: Sharlotte Alamo, DPM.   ANESTHESIA: General.   HEMOSTASIS: None.   ESTIMATED BLOOD LOSS: 25 mL or less.   PATHOLOGY: None.  INJECTABLES: 20 mL of 0.5% Marcaine plain.   COMPLICATIONS: None apparent.   OPERATIVE INDICATIONS: This is a 59 year old female with recent revascularization of her left lower extremity in December with subsequent development of gangrene in the fifth toe. This was amputated on December 31 and subsequently developed postoperative abscess and infection and was readmitted to the hospital for debridement.   OPERATIVE PROCEDURE: The patient was taken to the operating room and placed on the table in the supine position. Following satisfactory general LMA anesthesia, the left foot was prepped and draped in the usual sterile fashion.   Attention was then directed to the distal aspect of the left foot where previous sutures from the amputation site were removed. There was noted to be heavy purulent discharge on compression of the forefoot. An approximate 12 cm linear incision was made coursing from the amputation site proximal along an area of subcutaneous necrotic tissue. There was noted to be significant purulent discharge. The incision was carried by blunt dissection down to the level of bone at the fifth metatarsal revealing significant purulent and necrotic tissue. There was also a connecting area dorsally on the mid foot area with an approximately 1.5 cm diameter ulcerative area with some underlying pus, which did communicate to the lateral area as well. Using a VersaJet debrider on a level of 7, excisional debridement was performed of all of the devitalized  and purulent tissue. There were relatively good healthy bleeding tissues. The head of the fifth metatarsal was inspected and appeared to be in good shape with a healthy bleeding surface and no clear evidence of any bone infection. At this point, all of the wound edges were granular with a good bleeding base and the wound was packed with sterile saline soaked gauze and covered with 4 x 4's, fluffs, ABDs, Kerlix, and an Ace wrap. The patient was awakened and transported to the PACU with vital signs stable and in good condition.  ____________________________ Sharlotte Alamo, DPM tc:aw D: 08/19/2013 13:12:50 ET T: 08/19/2013 13:39:30 ET JOB#: [id number]  cc: Sharlotte Alamo, DPM, <Dictator> Algernon Huxley, MD Jalyn Rosero DPM ELECTRONICALLY SIGNED 09/30/2013 9:38

## 2014-11-28 NOTE — H&P (Signed)
Subjective/Chief Complaint cellulitis of left leg associated with gangrene of the left foot.   History of Present Illness The patient is a 59 yo woman who is admitted from the office with cellulitis of the left leg and worsening tissue oss of the left forefoot.  She was seen in the office today and found to have incresing pain and drainage from the left thigh wound.  The nursing staff noted a very strong foul odor from the wound as well.  She denies fever or chills  Past surgical Hx: 03/2014 Aorta bi-femora bypass 04/2014 Debridement of the left groin incision and left thigh wound 05/07/2014  Forefoot amputation of the left foot and redebridment of the left thigh wound.   Past History PAST MEDICAL HISTORY:  1. Hypertension.  2. Hyperlipidemia.  3. Peripheral vascular disease.  4. Chronic obstructive pulmonary disease.  5.            GERD 6.            Tabacco dependency 7.            CVA  PAST SURGICAL HISTORY: Angiogram and left leg stent placement. Left 5th toe amputation done 10 years ago.   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  HTN:   CVA:   hyperlipids:   atherosclerosis:   PVD - Peripheral Vascular Disease:   amputation 5th toe on Left foot:   Other, see comments: Stent L groin  ALLERGIES:  No Known Allergies:   HOME MEDICATIONS: Medication Instructions Status  acetaminophen-oxyCODONE 325 mg-7.5 mg oral tablet 1 tab(s) orally every 4 hours, As needed, moderate pain (4-6/10) Active  enoxaparin 40 milligram(s) subcutaneous every 24 hours Active  nicotine 21 mg/24 hr transdermal film, extended release 1 patch transdermal once a day Active  docusate sodium 100 mg oral capsule 2 cap(s) orally 2 times a day Active  aspirin 81 mg oral delayed release tablet 1 tab(s) orally once a day Active  fentaNYL 25 mcg/hr transdermal film, extended release 1 patch transdermal every 72 hours Active  PriLOSEC 10 mg oral delayed  release capsule 1 cap(s) orally once a day Active  Qvar 80 mcg/inh inhalation aerosol 2 puff(s) inhaled 2 times a day Active  Spiriva 18 mcg inhalation capsule 1 cap(s) inhaled once a day midday Active  fluticasone 50 mcg inhalation powder 2 spray(s) inhaled once a day (in the morning) each nostril Active  amLODIPine 10 mg oral tablet 1 tab(s) orally once a day (in the morning) Active  lovastatin 20 mg oral tablet 1 tab(s) orally once a day (at bedtime) Active  allopurinol 100 mg oral tablet 1 tab(s) orally once a day (in the morning) Active   Family and Social History:  Family History Non-Contributory   Social History positive  tobacco, negative ETOH, negative Illicit drugs   Review of Systems:  Fever/Chills No   Cough No   Sputum No   Abdominal Pain No   Diarrhea No   Constipation No   Nausea/Vomiting No   SOB/DOE No   Chest Pain No   Telemetry Reviewed NSR   Dysuria No   Tolerating PT No   Physical Exam:  GEN well developed, obese, ill appearing   HEENT hearing intact to voice, dry oral mucosa   NECK supple  trachea midline   RESP normal resp effort  no use of accessory muscles   CARD regular rate  no JVD   ABD denies tenderness  nondistended  EXTR positive edema, Left thigh wound lateral aspect 3x4x3  approximately 30 ml of yellow pus in the base of the ulcer,  forefoot and heel with extensive necrotic tissue and no evidence of granulation,  pedal pulses on the left are not palpable, Right DP is 1+   SKIN positive ulcers, skin turgor poor   NEURO cranial nerves intact, follows commands, motor/sensory function intact   PSYCH alert, A+O to time, place, person    Assessment/Admission Diagnosis 1.   Cellulitis of left leg with ulcerations and necrotic tissue          will begin Vancomycin          obtain wound culture          will need amputation but BKA vs AKA yet to be determined 2.   Hypertension.           conintue home meds            medicine on consult 3.    Hyperlipidemia.            continue statin  4.    Chronic obstructive pulmonary disease.            continue home meds               medicine on consult 5.     GERD             continue PPI 6.     Tobacco dependency             continue nicotene patch   Electronic Signatures: Hortencia Pilar (MD)  (Signed 14-Oct-15 16:29)  Authored: CHIEF COMPLAINT and HISTORY, PAST MEDICAL/SURGIAL HISTORY, ALLERGIES, HOME MEDICATIONS, FAMILY AND SOCIAL HISTORY, REVIEW OF SYSTEMS, PHYSICAL EXAM, ASSESSMENT AND PLAN   Last Updated: 14-Oct-15 16:29 by Hortencia Pilar (MD)

## 2014-11-28 NOTE — Op Note (Signed)
PATIENT NAME:  Natalie [name], Natalie [name] MR#:  [id number] DATE OF BIRTH:  1955-12-23  DATE OF PROCEDURE:  08/06/2013  PREOPERATIVE DIAGNOSES: 1.  Peripheral arterial disease with gangrene, left 5th toe.  2.  Status post left lower extremity revascularization.  3.  Hypertension  4.  Gout.   POSTOPERATIVE DIAGNOSES: 1.  Peripheral arterial disease with gangrene, left 5th toe.  2.  Status post left lower extremity revascularization.  3.  Hypertension  4.  Gout.   PROCEDURES: Left 5th ray amputation.   SURGEON: Leotis Pain, M.D.   ANESTHESIA: MAC.   ESTIMATED BLOOD LOSS: Minimal.   INDICATION FOR PROCEDURE: A 59 year old [place] American female who has undergone revascularization for severe ischemia of the left lower extremity. She has gangrene in the left 5th toe, and at this point needs to have this toe removed. Risks and benefits were discussed. Informed consent was obtained.   DESCRIPTION OF PROCEDURE: The patient is brought to the operative suite, and after an adequate level of intravenous sedation attained, the left foot was sterilely prepped and draped and a sterile surgical field was created. A fishmouth incision was created around the base of the left 5th toe. We dissected down. The tissue at this level appeared viable with some bleeding seen. I removed the toe and then used bone cutters and rongeurs to take the metatarsal head below.  The wound was then irrigated and closed with a series of figure-of-eight Vicryl sutures in the deeper tissue. The skin was closed with 5 mattress nylon sutures. Sterile dressing was then placed. The patient tolerated the procedure well and was taken to the recovery room in stable condition.  Marland Kitchen   DATE OF PROCEDURE: Thank you    ____________________________ Algernon Huxley, MD jsd:dmm D: 08/06/2013 11:45:34 ET T: 08/06/2013 12:08:54 ET JOB#: [id number]  cc: Algernon Huxley, MD, <Dictator> Algernon Huxley MD ELECTRONICALLY SIGNED 08/14/2013 13:36

## 2014-11-28 NOTE — Op Note (Signed)
PATIENT NAME:  Natalie [name], Natalie [name] MR#:  [id number] DATE OF BIRTH:  1955-10-07  DATE OF PROCEDURE:  03/25/2014  PREOPERATIVE DIAGNOSES: 1.  Aortic occlusion.  2.  Atherosclerotic occlusive disease, bilateral lower extremities, with rest pain and ulceration.  3.  Tobacco abuse.  4.  Renal insufficiency.  5.  Hypertension.   POSTOPERATIVE DIAGNOSES:  1.  Aortic occlusion.  2.  Atherosclerotic occlusive disease, bilateral lower extremities, with rest pain and ulceration.  3.  Tobacco abuse.  4.  Renal insufficiency.  5.  Hypertension.   PROCEDURES PERFORMED:  1.  Aortic and left renal artery endarterectomy.  2.  Right femoral endarterectomy.  3.  Aortobifemoral bypass graft using a bifurcated 14 x 7 Dacron graft.   PROCEDURE PERFORMED: Dr. Lucky Cowboy and Dr. Delana Meyer.    ANESTHESIA: General by endotracheal intubation.   FLUIDS: Per anesthesia record.   ESTIMATED BLOOD LOSS: 300 mL.   INDICATION: The patient is a 59 year old woman with an extensive history of peripheral vascular disease including aortoiliac disease. She has had multiple interventions in the past but these have not remained patent. She has now developed ulcerations, as well as rest pain and is requiring reconstructive vascular surgery for limb salvage. Risks and benefits have been reviewed with the patient. All questions answered. The patient has agreed to proceed.   DESCRIPTION OF PROCEDURE: The patient is taken to the Operating Room and placed in the supine position. After adequate general anesthesia is induced and appropriate invasive monitors are placed, she is positioned supine and subsequently prepped from the nipple line to the knees and then draped in a sterile fashion. Appropriate timeout is called. With myself working on the left and Dr. Lucky Cowboy working on the right, vertical incisions are made in each groin and the dissection carried down to expose the femoral arteries. The common femoral artery is then dissected  circumferentially from the level of the ilioinguinal ligament down to the femoral bifurcation, and the SFA, common femoral, and profunda femoris are all looped with Silastic vessel loops. Both groins are then packed with saline moistened laparotomy pads.   The abdominal cavity is then entered from a midline incision created from the xiphoid process to the symphysis pubis. These are run from the ligament of Treitz to the cecum and then the small intestine is packed in the right gutter. Omni-Tract retractor is then placed. The retroperitoneal space is then entered. A significant amount of fibrotic reactive tissue is noted surrounding the aorta, making the aortic dissection very difficult. The left renal vein is identified and subsequently the left renal artery is identified and the aorta dissected just below the level of the renal on the left. The right renal artery is then identified and the right side of the aorta is dissected.   Tunnels were then created from the abdominal cavity to the knee and umbilical tapes passed into each groin. The patient is given 5000 units of heparin which is allowed to circulate for approximately 5 minutes. The infrarenal aorta was clamped and then the aorta opened. At the level of the clamp, the aorta is still found to be occluded, and endarterectomy up to the clamp does not provide for adequate inflow. Therefore, the left renal vein is retracted in a craniad direction and the dissection is carried out above the renal arteries, as well as around the origins of the renals. Silastic vessel loops were placed around both the right and left renal. The aortic clamp is then repositioned and endarterectomy is then extended up  the aorta. The left renal artery is well visualized and there is plaque extending into the renal artery, and this is freed using an eversion technique. The origin of the right renal artery appears to be free of plaque. Using [place] forceps and a Soil scientist,  endarterectomy is completed, and the distal aorta is irrigated. It is then tested briefly demonstrating excellent inflow and flushing any residual debris.   A 14 x 7 graft is then delivered onto the surgical field and an end aorta to end graft anastomosis is fashioned using running 3-0 Prolene. Flushing maneuvers are performed and the clamp is subsequently repositioned to the infrarenal location. Total clamp time was approximately 30 minutes.   The limbs are then pulled through the previously created tunnels and working first on the right and then the left sequentially, arteriotomies are made on the right, endarterectomy is performed and subsequently the graft is beveled, and an end graft to side common femoral artery anastomosis is fashioned using running 5-0 Prolene. Flushing maneuvers are performed and flow was established to the right leg.   Working now on the left, again the common superficial and profunda femoris are controlled. The arteriotomy is then performed and the graft is beveled, and end graft to side femoral artery anastomosis is fashioned using running 5-0 Prolene. Again, flushing maneuvers are performed and flow was established down to the left. Both groins are then irrigated with sterile saline, inspected for hemostasis, and then packed with saline moistened sponges.   The abdomen is then examined. Evicel is placed around the suture line and in the retroperitoneal space. The retroperitoneal space was closed with running 0 Vicryl. Viscera is returned to its anatomic location and the fascia is reapproximated with loop #1 PDS. Subcutaneous tissues are closed with 3-0 Vicryl.   Both groins are then reinspected and closed in approximately 5 layers, including several of 2-0 Vicryl, followed by several of 3-0 Vicryl, and then 4-0 Monocryl subcuticular. Dermabond is applied. Staples are used to close the skin on the belly. The patient tolerated the procedure well. There were no immediate  complications. Sponge and needle counts were correct, and she is taken to the recovery area in stable condition.     ____________________________ Katha Cabal, MD ggs:at D: 03/27/2014 18:56:00 ET T: 03/27/2014 20:25:30 ET JOB#: [id number]  cc: Katha Cabal, MD, <Dictator> Katha Cabal MD ELECTRONICALLY SIGNED 04/14/2014 22:39

## 2014-12-01 ENCOUNTER — Encounter: Payer: Self-pay | Admitting: Physical Therapy

## 2014-12-01 ENCOUNTER — Ambulatory Visit: Payer: Medicare HMO | Admitting: Physical Therapy

## 2014-12-01 DIAGNOSIS — R269 Unspecified abnormalities of gait and mobility: Secondary | ICD-10-CM

## 2014-12-01 DIAGNOSIS — R2689 Other abnormalities of gait and mobility: Secondary | ICD-10-CM

## 2014-12-01 DIAGNOSIS — R6889 Other general symptoms and signs: Secondary | ICD-10-CM

## 2014-12-01 NOTE — Patient Instructions (Signed)
Do each exercise 2  times per day Do each exercise 10 repetitions Hold each exercise for 3-5 seconds to feel your location  AT SINK FIND YOUR MIDLINE POSITION AND PLACE FEET EQUAL DISTANCE FROM THE MIDLINE.  USE TAPE ON FLOOR TO MARK THE MIDLINE POSITION. You also should try to feel with your limb pressure in socket.  You are trying to feel with limb what you used to feel with the bottom of your foot.  1. Side to Side Shift: Moving your hips only (not shoulders): move weight onto your left leg, HOLD/FEEL.  Move back to equal weight on each leg, HOLD/FEEL. Move weight onto your right leg, HOLD/FEEL. Move back to equal weight on each leg, HOLD/FEEL. Repeat. 2. Front to Back Shift: Moving your hips only (not shoulders): move your weight forward onto your toes, HOLD/FEEL. Move your weight back to equal Flat Foot on both legs, HOLD/FEEL. Move your weight back onto your heels, HOLD/FEEL. Move your weight back to equal on both legs, HOLD/FEEL. Repeat. 3. Moving Cones / Cups: With equal weight on each leg: Hold on with one hand the first time, then progress to no hand supports. Move cups from one side of sink to the other. Place cups ~2" out of your reach, progress to 10" beyond reach. 4. Overhead/Upward Reaching: alternated reaching up to top cabinets or ceiling if no cabinets present. Keep equal weight on each leg. Start with one hand support on counter while other hand reaches and progress to no hand support with reaching. 5.   Looking Over Shoulders: With equal weight on each leg: alternate turning to look    over your shoulders with one hand support on counter as needed. Shift weight to side looking, pull hip then shoulder then head/eyes around to look behind you. Start with one hand support & progress to no hand support. 

## 2014-12-01 NOTE — Therapy (Signed)
[facility] [address] [place], [place], [address] Phone: [phone]   Fax:  [phone]  Physical Therapy Treatment  Patient Details  Name: Natalie [name] MRN: [id number] Date of Birth: Dec 24, 1955 Referring Provider:  Algernon Huxley, MD  Encounter Date: 12/01/2014      PT End of Session - 12/01/14 1824    Visit Number 2   Number of Visits 17   Date for PT Re-Evaluation 01/11/15   PT Start Time [id number]   PT Stop Time 1530   PT Time Calculation (min) 43 min   Equipment Utilized During Treatment Gait belt   Activity Tolerance Patient tolerated treatment well   Behavior During Therapy [facility] for tasks assessed/performed      History reviewed. No pertinent past medical history.  History reviewed. No pertinent past surgical history.  There were no vitals filed for this visit.  Visit Diagnosis:  Abnormality of gait  Balance problems  Decreased functional activity tolerance      Subjective Assessment - 12/01/14 1453    Subjective No new compliants. No falls or pain to report.   Currently in Pain? No/denies          [facility] Adult PT Treatment/Exercise - 12/01/14 1453    Ambulation/Gait   Ambulation/Gait Yes   Ambulation/Gait Assistance 4: Min assist   Ambulation/Gait Assistance Details cues on posture and to correct gait deviations.   Ambulation Distance (Feet) 80 Feet   Assistive device Prosthesis;Rolling walker   Gait Pattern Step-to pattern;Step-through pattern;Decreased stride length;Decreased weight shift to left;Decreased step length - right;Decreased stance time - left;Trunk flexed;Narrow base of support   Ambulation Surface Level;Indoor   Prosthetics   Prosthetic Care Comments  Instructed pt to wear prosthesis 2 hours 2 x day every day for consistent wear and to assist in her getting used to the prosthesis. pt agreed to try this.                           Current prosthetic wear tolerance (days/week)   ~5 days a week   Current prosthetic wear tolerance (#hours/day)  2-3 hours a day, not wearing it twice a day   Residual limb condition  wound checked, yellow eschar removed with forceps/scissors. gauze packing applied and covered with Tegaderm.                 Education Provided Residual limb care;Skin check;Proper wear schedule/adjustment  donning and tightening strap   Person(s) Educated Patient   Education Method Explanation;Verbal cues   Education Method Verbalized understanding   Donning Prosthesis Minimal assist   Doffing Prosthesis Supervision     Neuro Re-ed: Pt educated on exercises at the sink for balance, proprioception and prosthetic weight bearing. Written instructions provided.        PT Education - 12/01/14 1828    Education provided Yes   Education Details HEP: sink activites for balance, proprioception and for prosthetic weight bearing.   Person(s) Educated Patient   Methods Explanation;Demonstration;Handout;Verbal cues   Comprehension Verbalized understanding;Verbal cues required;Need further instruction          PT Short Term Goals - 11/12/14 1445    PT SHORT TERM GOAL #1   Title donnes prosthesis correclty modified independent. (Target Date: 12/11/2014)   Time 4   Period Weeks   Status New   PT SHORT TERM GOAL #2   Title tolerates wear of prosthesis >8hrs total per day without skin issues or  discomfort. (Target Date: 12/11/2014)   Time 4   Period Weeks   Status New   PT SHORT TERM GOAL #3   Title standing balance with rolling walker & prosthesis reaching 10" all directions, to floor, and manages clothes including toileting modified independent. (Target Date: 12/11/2014)   Time 4   Period Weeks   Status New   PT SHORT TERM GOAL #4   Title ambulates 150' with rolling walker & prosthesis with supervision. (Target Date: 12/11/2014)   Time 4   Period Weeks   Status New   PT SHORT TERM GOAL #5   Title negotiates ramp, curb with RW and stairs wtih 2 rails with  prosthesis with supervision. (Target Date: 12/11/2014)   Time 4   Period Weeks   Status New           PT Long Term Goals - 11/12/14 1445    PT LONG TERM GOAL #1   Title independent with prosthetic care. (Target Date: 01/08/2015)   Time 8   Period Weeks   Status New   PT LONG TERM GOAL #2   Title toelrates wear of prosthesis >90% of awake hours without issues. (Target Date: 01/08/2015)   Time 8   Period Weeks   Status New   PT LONG TERM GOAL #3   Title Berg Balance >24/56 with prosthesis. (Target Date: 01/08/2015)   Time 8   Period Weeks   Status New   PT LONG TERM GOAL #4   Title ambulates 300' with LRAD & prosthesis modified independent. (Target Date: 01/08/2015)   Time 8   Period Weeks   Status New   PT LONG TERM GOAL #5   Title negotiates ramp, curb and stairs with LRAD modified indepenent. (Target Date: 01/08/2015)   Time 8   Period Weeks   Status New           Plan - 12/01/14 1824    Clinical Impression Statement Pt progressing well towards goals. Demo'd good prosthetic knee control with standing balance activities and with gait during this session.   Pt will benefit from skilled therapeutic intervention in order to improve on the following deficits Abnormal gait;Decreased activity tolerance;Decreased balance;Decreased endurance;Decreased knowledge of precautions;Decreased knowledge of use of DME;Decreased mobility;Decreased range of motion;Decreased strength   Rehab Potential Good   PT Frequency 2x / week   PT Duration 8 weeks   PT Treatment/Interventions ADLs/Self Care Home Management;DME Instruction;Gait training;Stair training;Functional mobility training;Therapeutic activities;Therapeutic exercise;Balance training;Neuromuscular re-education;Patient/family education;Other (comment)  prosthetic training   PT Next Visit Plan review prosthetic care, HEP, gait with RW, instruct in barriers   PT Home Exercise Plan mid-line at sink   Consulted and Agree with Plan of Care  Patient;Family member/caregiver   Family Member Consulted fiance        Problem List There are no active problems to display for this patient.   Willow Ora 12/01/2014, 6:29 PM  Willow Ora, PTA, [facility] [address] [place], [place] [address] [phone] 12/01/2014, 6:32 PM

## 2014-12-03 ENCOUNTER — Ambulatory Visit: Payer: Medicare HMO | Admitting: Physical Therapy

## 2014-12-03 DIAGNOSIS — R2689 Other abnormalities of gait and mobility: Secondary | ICD-10-CM

## 2014-12-03 DIAGNOSIS — R6889 Other general symptoms and signs: Secondary | ICD-10-CM

## 2014-12-03 DIAGNOSIS — R269 Unspecified abnormalities of gait and mobility: Secondary | ICD-10-CM

## 2014-12-03 DIAGNOSIS — Z89612 Acquired absence of left leg above knee: Secondary | ICD-10-CM

## 2014-12-04 NOTE — Therapy (Signed)
[facility] [address] [place], [place], [address] Phone: [phone]   Fax:  [phone]  Physical Therapy Treatment  Patient Details  Name: Natalie [name] MRN: [id number] Date of Birth: May 14, 1956 Referring Provider:  Algernon Huxley, MD  Encounter Date: 12/03/2014      PT End of Session - 12/03/14 1100    Visit Number 3   Number of Visits 17   Date for PT Re-Evaluation 01/11/15   PT Start Time 1100   PT Stop Time 1149   PT Time Calculation (min) 49 min   Equipment Utilized During Treatment Gait belt   Activity Tolerance Patient tolerated treatment well   Behavior During Therapy [facility] for tasks assessed/performed      No past medical history on file.  No past surgical history on file.  There were no vitals filed for this visit.  Visit Diagnosis:  Abnormality of gait  Balance problems  Decreased functional activity tolerance  Status post above knee amputation of left lower extremity      Subjective Assessment - 12/03/14 1100    Subjective Wore prosthesis 2 hours without issues.   Currently in Pain? No/denies      Prosthetic use and care:   PT instructed while patient performing donning, patient verbalized understanding. PT instructed to increase wear to 3 hrs 2x/day. Prosthetic Training: Sit to stand with  Supervision  from chairs with armrests to RW with cues on knee control. Stand to sit with  Supervision  to chairs with armrests from [place] with cues on technique. Patient ambulated with Supervision Device: Rolling walker & prosthesis  Distance: 200' X 2 on surfaces: indoor Multi-modal cues for deviations: step length by changing sequence to move RW after each step to facilitate upright posture and step-thru pattern, wt shift over prosthesis in stance Patient negotiated ramp with Contact guard assist Device: Rolling walker & prosthesis after PT demo technique with verbal & tactile cues Patient negotiated  curb with Contact guard assist Device: Rolling walker & prosthesis verbal & tactile cues on technique Patient negotiated stairs with Supervision Device: Two handrails and left handrail with 2 hands & prosthesis step-to pattern cues on technique & sequence                             PT Education - 12/03/14 1100    Education provided Yes   Education Details donning prosthesis, increase wear 3 hrs 2 x/day, gait with RW / prosthesis only on level surfaces   Person(s) Educated Patient   Methods Explanation   Comprehension Verbalized understanding          PT Short Term Goals - 11/12/14 1445    PT SHORT TERM GOAL #1   Title donnes prosthesis correclty modified independent. (Target Date: 12/11/2014)   Time 4   Period Weeks   Status New   PT SHORT TERM GOAL #2   Title tolerates wear of prosthesis >8hrs total per day without skin issues or discomfort. (Target Date: 12/11/2014)   Time 4   Period Weeks   Status New   PT SHORT TERM GOAL #3   Title standing balance with rolling walker & prosthesis reaching 10" all directions, to floor, and manages clothes including toileting modified independent. (Target Date: 12/11/2014)   Time 4   Period Weeks   Status New   PT SHORT TERM GOAL #4   Title ambulates 150' with rolling walker & prosthesis with supervision. (  Target Date: 12/11/2014)   Time 4   Period Weeks   Status New   PT SHORT TERM GOAL #5   Title negotiates ramp, curb with RW and stairs wtih 2 rails with prosthesis with supervision. (Target Date: 12/11/2014)   Time 4   Period Weeks   Status New           PT Long Term Goals - 11/12/14 1445    PT LONG TERM GOAL #1   Title independent with prosthetic care. (Target Date: 01/08/2015)   Time 8   Period Weeks   Status New   PT LONG TERM GOAL #2   Title toelrates wear of prosthesis >90% of awake hours without issues. (Target Date: 01/08/2015)   Time 8   Period Weeks   Status New   PT LONG TERM GOAL #3   Title Berg  Balance >24/56 with prosthesis. (Target Date: 01/08/2015)   Time 8   Period Weeks   Status New   PT LONG TERM GOAL #4   Title ambulates 300' with LRAD & prosthesis modified independent. (Target Date: 01/08/2015)   Time 8   Period Weeks   Status New   PT LONG TERM GOAL #5   Title negotiates ramp, curb and stairs with LRAD modified indepenent. (Target Date: 01/08/2015)   Time 8   Period Weeks   Status New               Plan - 12/03/14 1100    Clinical Impression Statement Patient appears safe to use prosthesis with RW on level surfaces. Patient has a general understanding of barriers but needs further instruction.   Pt will benefit from skilled therapeutic intervention in order to improve on the following deficits Abnormal gait;Decreased activity tolerance;Decreased balance;Decreased endurance;Decreased knowledge of precautions;Decreased knowledge of use of DME;Decreased mobility;Decreased range of motion;Decreased strength   Rehab Potential Good   PT Frequency 2x / week   PT Duration 8 weeks   PT Treatment/Interventions ADLs/Self Care Home Management;DME Instruction;Gait training;Stair training;Functional mobility training;Therapeutic activities;Therapeutic exercise;Balance training;Neuromuscular re-education;Patient/family education;Other (comment)  prosthetic training   PT Next Visit Plan review prosthetic care, HEP, gait with RW, instruct in barriers   Consulted and Agree with Plan of Care Patient;Family member/caregiver   Family Member Consulted fiance        Problem List There are no active problems to display for this patient.   Jamey Reas PT, DPT 12/04/2014, 12:33 AM  [facility] [address] [place], [place], [address] Phone: [phone]   Fax:  [phone]

## 2014-12-07 ENCOUNTER — Encounter: Payer: Self-pay | Admitting: Physical Therapy

## 2014-12-07 ENCOUNTER — Ambulatory Visit: Payer: Medicare HMO | Attending: Vascular Surgery | Admitting: Physical Therapy

## 2014-12-07 DIAGNOSIS — Z89612 Acquired absence of left leg above knee: Secondary | ICD-10-CM

## 2014-12-07 DIAGNOSIS — M24652 Ankylosis, left hip: Secondary | ICD-10-CM

## 2014-12-07 DIAGNOSIS — R2689 Other abnormalities of gait and mobility: Secondary | ICD-10-CM

## 2014-12-07 DIAGNOSIS — R269 Unspecified abnormalities of gait and mobility: Secondary | ICD-10-CM

## 2014-12-07 DIAGNOSIS — R6889 Other general symptoms and signs: Secondary | ICD-10-CM | POA: Diagnosis not present

## 2014-12-07 DIAGNOSIS — R29818 Other symptoms and signs involving the nervous system: Secondary | ICD-10-CM | POA: Diagnosis not present

## 2014-12-07 NOTE — Therapy (Signed)
[facility] [address] [place], [place], [address] Phone: [phone]   Fax:  [phone]  Physical Therapy Treatment  Patient Details  Name: Natalie [name] MRN: [id number] Date of Birth: [birth year] Referring Provider:  Algernon Huxley, MD  Encounter Date: 12/07/2014      PT End of Session - 12/07/14 1015    Visit Number 4   Number of Visits 17   Date for PT Re-Evaluation 01/11/15   PT Start Time 9292   PT Stop Time 1100   PT Time Calculation (min) 45 min   Equipment Utilized During Treatment Gait belt   Activity Tolerance Patient tolerated treatment well   Behavior During Therapy [facility] for tasks assessed/performed      History reviewed. No pertinent past medical history.  History reviewed. No pertinent past surgical history.  There were no vitals filed for this visit.  Visit Diagnosis:  Abnormality of gait  Balance problems  Decreased functional activity tolerance  Status post above knee amputation of left lower extremity  Decreased range of hip movement, left      Subjective Assessment - 12/07/14 1105    Subjective Pt reports she has been wearing the prosthesis 4-5 hours twice a day with minor groin pressure, but no other issues   Currently in Pain? No/denies      Prosthetic Training: Patient ambulated with Supervision Device: Rolling walker & prosthesis  Distance: 262' x1, 125' x1 on surfaces: indoor level. Pt was able to tolerate 45 minutes of activity with no sitting rest breaks. Performed standing balance activities and exercises between 2 gait bouts.  Multi-modal cues for deviations: weight shifting over prosthesis, proper placement of prosthesis during stance                      [facility] Adult PT Treatment/Exercise - 12/07/14 0001    Posture/Postural Control   Posture/Postural Control Postural limitations   Dynamic Standing Balance   Dynamic Standing - Balance Support No upper  extremity supported;During functional activity  in parallel bars   Dynamic Standing - Level of Assistance 5: Stand by assistance   Dynamic Standing - Balance Activities Reaching for weighted objects  red theraband reciprocal & BUE row, upward reach, frwd reach   Dynamic Standing - Comments Verbal and visual cues with mirror for midline weight shift over prosthesis   High Level Balance   High Level Balance Activities Side stepping;Backward walking;Other (comment)  In parallel bars   High Level Balance Comments visual, verbal, and tactile cues for proper weight shift over prosthesis in stance   Knee/Hip Exercises: Stretches   Hip Flexor Stretch 5 reps;20 seconds   Hip Flexor Stretch Limitations Lunge in parallel bars with BUE support   Prosthetics   Education Provided Correct ply sock adjustment;Other (comment)  Tightening suspension strap   Person(s) Educated Patient   Education Method Explanation   Education Method Verbalized understanding;Needs further instruction                PT Education - 12/07/14 1115    Education provided Yes   Education Details See prosthetic care in flowsheet, hip flexor stretching at home   Person(s) Educated Patient   Methods Explanation;Demonstration   Comprehension Verbalized understanding;Returned demonstration;Need further instruction          PT Short Term Goals - 12/07/14 1015    PT SHORT TERM GOAL #1   Title donnes prosthesis correclty modified independent. (Target Date: 12/11/2014)   Baseline Met  12/07/14   Time 4   Period Weeks   Status Achieved   PT SHORT TERM GOAL #2   Title tolerates wear of prosthesis >8hrs total per day without skin issues or discomfort. (Target Date: 12/11/2014)   Baseline Met 12/07/14   Time 4   Period Weeks   Status Achieved   PT SHORT TERM GOAL #3   Title standing balance with rolling walker & prosthesis reaching 10" all directions, to floor, and manages clothes including toileting modified independent.  (Target Date: 12/11/2014)   Time 4   Period Weeks   Status New   PT SHORT TERM GOAL #4   Title ambulates 150' with rolling walker & prosthesis with supervision. (Target Date: 12/11/2014)   Baseline Met 12/07/14   Time 4   Period Weeks   Status Achieved   PT SHORT TERM GOAL #5   Title negotiates ramp, curb with RW and stairs wtih 2 rails with prosthesis with supervision. (Target Date: 12/11/2014)   Time 4   Period Weeks   Status New           PT Long Term Goals - 11/12/14 1445    PT LONG TERM GOAL #1   Title independent with prosthetic care. (Target Date: 01/08/2015)   Time 8   Period Weeks   Status New   PT LONG TERM GOAL #2   Title toelrates wear of prosthesis >90% of awake hours without issues. (Target Date: 01/08/2015)   Time 8   Period Weeks   Status New   PT LONG TERM GOAL #3   Title Berg Balance >24/56 with prosthesis. (Target Date: 01/08/2015)   Time 8   Period Weeks   Status New   PT LONG TERM GOAL #4   Title ambulates 300' with LRAD & prosthesis modified independent. (Target Date: 01/08/2015)   Time 8   Period Weeks   Status New   PT LONG TERM GOAL #5   Title negotiates ramp, curb and stairs with LRAD modified indepenent. (Target Date: 01/08/2015)   Time 8   Period Weeks   Status New               Plan - 12/07/14 1015    Clinical Impression Statement Pt improved with lateral weight shift over prosthesis during gait, demonstrated understanding of hip flexor stretches at home.    Pt will benefit from skilled therapeutic intervention in order to improve on the following deficits Abnormal gait;Decreased activity tolerance;Decreased balance;Decreased endurance;Decreased knowledge of precautions;Decreased knowledge of use of DME;Decreased mobility;Decreased range of motion;Decreased strength   Rehab Potential Good   PT Frequency 2x / week   PT Duration 8 weeks   PT Treatment/Interventions ADLs/Self Care Home Management;DME Instruction;Gait training;Stair  training;Functional mobility training;Therapeutic activities;Therapeutic exercise;Balance training;Neuromuscular re-education;Patient/family education;Other (comment)  prosthetic training   PT Next Visit Plan Assess remaining 2 STGs. review prosthetic care, HEP, gait with RW including barriers, balance activities   PT Home Exercise Plan midline at sink   Consulted and Agree with Plan of Care Patient;Family member/caregiver   Family Member Consulted fiance        Problem List There are no active problems to display for this patient.   [place], [place] 12/07/2014, 11:28 AM  [facility] [address] [place], [place], [address] Phone: [phone]   Fax:  [phone]

## 2014-12-09 ENCOUNTER — Ambulatory Visit: Payer: Medicare HMO | Admitting: Physical Therapy

## 2014-12-09 ENCOUNTER — Encounter: Payer: Self-pay | Admitting: Physical Therapy

## 2014-12-09 DIAGNOSIS — R269 Unspecified abnormalities of gait and mobility: Secondary | ICD-10-CM | POA: Diagnosis not present

## 2014-12-09 DIAGNOSIS — R6889 Other general symptoms and signs: Secondary | ICD-10-CM

## 2014-12-09 DIAGNOSIS — R2689 Other abnormalities of gait and mobility: Secondary | ICD-10-CM

## 2014-12-09 NOTE — Therapy (Signed)
[facility] [address] [place], [place], [address] Phone: [phone]   Fax:  [phone]  Physical Therapy Treatment  Patient Details  Name: Natalie [name] MRN: [id number] Date of Birth: 04-15-1956 Referring Provider:  Algernon Huxley, MD  Encounter Date: 12/09/2014      PT End of Session - 12/09/14 0937    Visit Number 5   Number of Visits 17   Date for PT Re-Evaluation 01/11/15   PT Start Time 0931   PT Stop Time 1015   PT Time Calculation (min) 44 min   Equipment Utilized During Treatment Gait belt   Activity Tolerance Patient tolerated treatment well   Behavior During Therapy [facility] for tasks assessed/performed      History reviewed. No pertinent past medical history.  History reviewed. No pertinent past surgical history.  There were no vitals filed for this visit.  Visit Diagnosis:  Abnormality of gait  Balance problems  Decreased functional activity tolerance      Subjective Assessment - 12/09/14 0937    Subjective No new complaints. No falls or pain to report.            [facility] Adult PT Treatment/Exercise - 12/09/14 0941    Ambulation/Gait   Ambulation/Gait Yes   Ambulation/Gait Assistance 4: Min guard;5: Supervision   Ambulation/Gait Assistance Details cues for increased weight shift onto left leg in stance with continuous walker movement to allow for improved step throught gait pattern                     Ambulation Distance (Feet) 230 Feet  x1   Assistive device Prosthesis;Rolling walker   Gait Pattern Step-through pattern;Decreased step length - right;Decreased stance time - left;Decreased weight shift to left;Trunk flexed;Narrow base of support   Ambulation Surface Level;Indoor   Stairs Yes   Stairs Assistance 5: Supervision   Stairs Assistance Details (indicate cue type and reason) occasional cues to advance hands along rails for improved technique                              Stair  Management Technique Two rails;Step to pattern;Forwards   Number of Stairs 4   Ramp 5: Supervision  with walker and prosthesis   Ramp Details (indicate cue type and reason) cues on posture and step length   Curb 5: Supervision  with walker and prosthesis   Curb Details (indicate cue type and reason) cues on staggered stance for balance   Dynamic Standing Balance   Dynamic Standing - Balance Support No upper extremity supported;During functional activity   Dynamic Standing - Level of Assistance 4: Min assist   Dynamic Standing - Balance Activities Reaching across midline;Compliant surfaces;Eyes open;Eyes closed;Head turns;Head nods   Dynamic Standing - Comments on floor with 1 UE support on walker: reaching all directions >/=10 inches and to floor with supervision. in parallel bars on air ex: eyes closed no head movements, eyes open head nods/shakes x 10 each, eyes open alternating UE raises x 10 each side.                                  High Level Balance   High Level Balance Activities Side stepping;Backward walking   High Level Balance Comments in parallel bars with bil UE assist: cues on posture and technique x 6 laps each.  Prosthetics   Prosthetic Care Comments  Pt with complaints of socket digging into her groin area. Discussed if she has enought sock ply on to promote proper socket fit and if that does not work to see her prosthetist. She has an appointment with the prosthetist on Monday. Pt plans to adjust sock ply when she gets home and play around with adjusting number to see if she can get a more comfortable fit. If not she will have the prosthetist address this issue on Monday as this is limiting her stance/weight bearing tolerance.                          Current prosthetic wear tolerance (days/week)  dailly   Current prosthetic wear tolerance (#hours/day)  all awake hours  drying 1 x a day currently when taking off for about 30 min   Residual limb condition  intact other than  wound, small, healing with scant yellow eschar and covered with Tegaderm   Education Provided Residual limb care  drying more often for sweat control   Person(s) Educated Patient   Education Method Explanation   Education Method Verbalized understanding   Donning Prosthesis Supervision   Doffing Prosthesis Supervision           PT Short Term Goals - 12/09/14 [phone]    PT SHORT TERM GOAL #1   Title donnes prosthesis correclty modified independent. (Target Date: 12/11/2014)   Baseline Met 12/07/14   Time 4   Period Weeks   Status Achieved   PT SHORT TERM GOAL #2   Title tolerates wear of prosthesis >8hrs total per day without skin issues or discomfort. (Target Date: 12/11/2014)   Baseline Met 12/07/14   Time 4   Period Weeks   Status Achieved   PT SHORT TERM GOAL #3   Title standing balance with rolling walker & prosthesis reaching 10" all directions, to floor, and manages clothes including toileting modified independent. (Target Date: 12/11/2014)   Time 4   Period Weeks   Status New   PT SHORT TERM GOAL #4   Title ambulates 150' with rolling walker & prosthesis with supervision. (Target Date: 12/11/2014)   Baseline Met 12/07/14   Time 4   Period Weeks   Status Achieved   PT SHORT TERM GOAL #5   Title negotiates ramp, curb with RW and stairs wtih 2 rails with prosthesis with supervision. (Target Date: 12/11/2014)   Time 4   Period Weeks   Status New           PT Long Term Goals - 11/12/14 1445    PT LONG TERM GOAL #1   Title independent with prosthetic care. (Target Date: 01/08/2015)   Time 8   Period Weeks   Status New   PT LONG TERM GOAL #2   Title toelrates wear of prosthesis >90% of awake hours without issues. (Target Date: 01/08/2015)   Time 8   Period Weeks   Status New   PT LONG TERM GOAL #3   Title Berg Balance >24/56 with prosthesis. (Target Date: 01/08/2015)   Time 8   Period Weeks   Status New   PT LONG TERM GOAL #4   Title ambulates 300' with LRAD & prosthesis  modified independent. (Target Date: 01/08/2015)   Time 8   Period Weeks   Status New   PT LONG TERM GOAL #5   Title negotiates ramp, curb and stairs with LRAD modified indepenent. (Target Date: 01/08/2015)  Time 8   Period Weeks   Status New           Plan - 12/09/14 0141    Clinical Impression Statement Pt met her remaining STG's and is making good progress toward her LTG's.    Pt will benefit from skilled therapeutic intervention in order to improve on the following deficits Abnormal gait;Decreased activity tolerance;Decreased balance;Decreased endurance;Decreased knowledge of precautions;Decreased knowledge of use of DME;Decreased mobility;Decreased range of motion;Decreased strength   Rehab Potential Good   PT Frequency 2x / week   PT Duration 8 weeks   PT Treatment/Interventions ADLs/Self Care Home Management;DME Instruction;Gait training;Stair training;Functional mobility training;Therapeutic activities;Therapeutic exercise;Balance training;Neuromuscular re-education;Patient/family education;Other (comment)  prosthetic training   PT Next Visit Plan gait with RW including barriers, balance activities.   PT Home Exercise Plan midline at sink   Consulted and Agree with Plan of Care Patient;Family member/caregiver   Family Member Consulted fiance      Problem List There are no active problems to display for this patient.   Willow Ora 12/09/2014, 10:26 AM  Willow Ora, PTA, [facility] [address], [place], [place] [address] [phone] 12/09/2014, 10:26 AM

## 2014-12-11 IMAGING — CR DG CHEST 2V
1 series · 2 of 2 positions shown · non-contrast
Comparison: 04/18/2013

ADDENDUM:
These results will be called to the ordering clinician or
representative by the Radiologist Assistant, and communication
documented in the PACS Dashboard.
CLINICAL DATA: Hypoxia, rule out congestive heart failure

EXAM:
CHEST  2 VIEW

[Series 1: ap · 0.17mm/px · 2 of 2 slices shown]
[im 1/2]
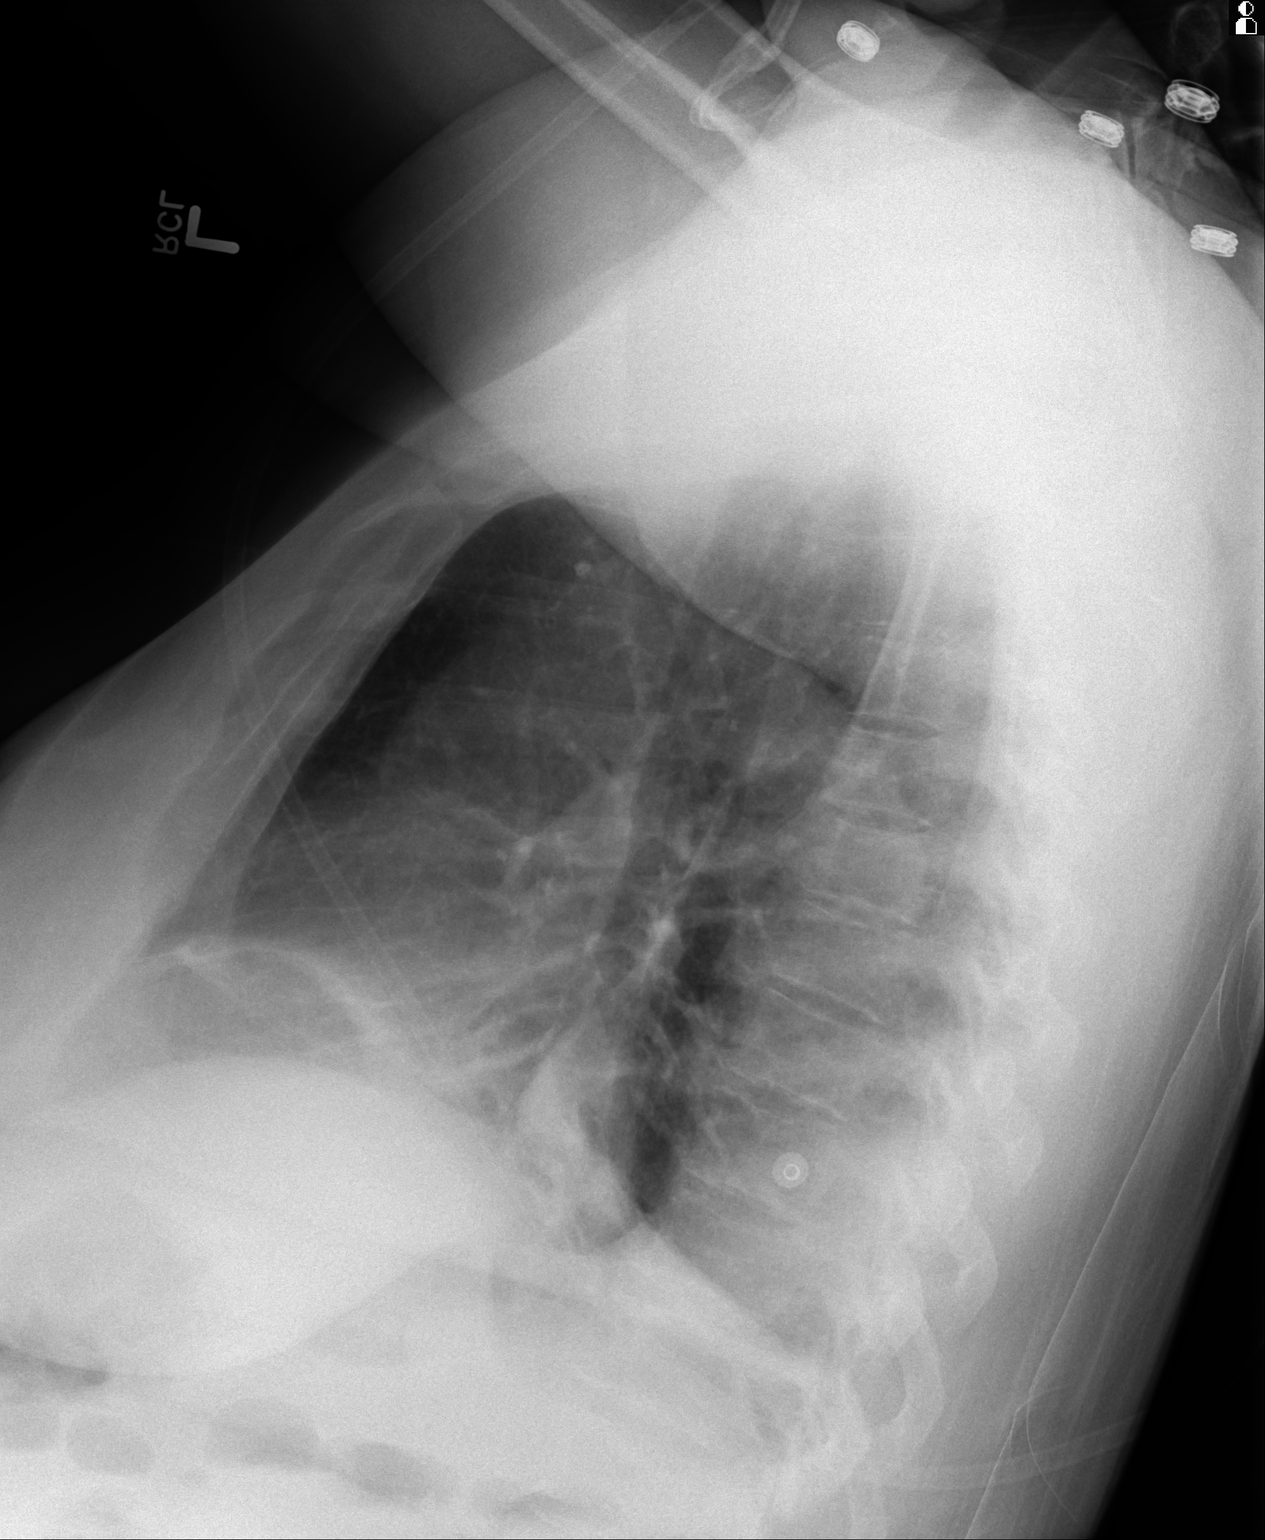
[im 2/2]
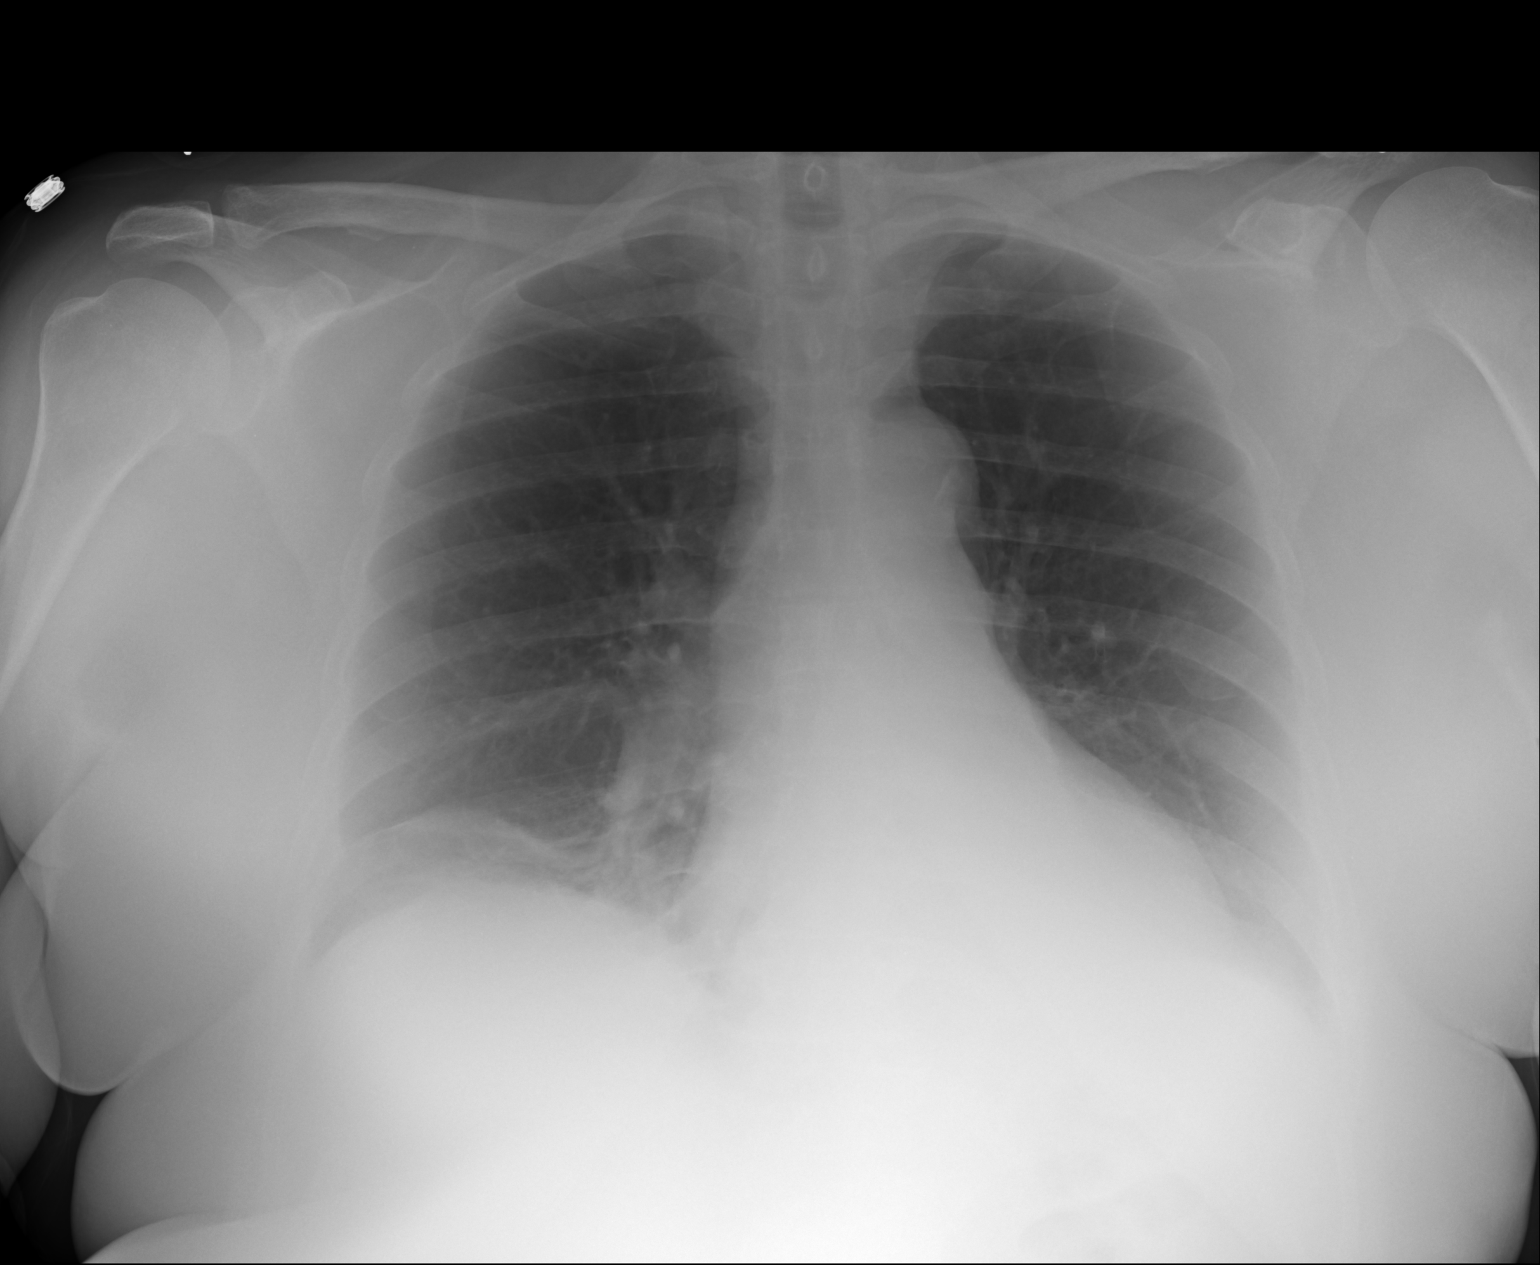

[2 of 2 positions shown; findings below may reference images not displayed]

FINDINGS: Heart size and vascular pattern are normal. There is again an
arcuate band of attenuation in the right middle lobe anteriorly,
with vascular crowding in the inferior right hilum. The left lung is
clear and there are no pleural effusions. When compared to the prior
study, there is improved right middle lobe aeration.
IMPRESSION: Persistent opacity in the right middle lobe suggesting subsegmental
atelectasis, persistent. A CT thorax would be suggested, preferably
with contrast, to exclude a partially obstructing central lesion.

There is no evidence of congestive heart failure.

## 2014-12-14 ENCOUNTER — Encounter: Payer: Self-pay | Admitting: Physical Therapy

## 2014-12-14 ENCOUNTER — Ambulatory Visit: Payer: Medicare HMO | Admitting: Physical Therapy

## 2014-12-14 DIAGNOSIS — R6889 Other general symptoms and signs: Secondary | ICD-10-CM

## 2014-12-14 DIAGNOSIS — R269 Unspecified abnormalities of gait and mobility: Secondary | ICD-10-CM

## 2014-12-14 DIAGNOSIS — Z89612 Acquired absence of left leg above knee: Secondary | ICD-10-CM

## 2014-12-14 DIAGNOSIS — R2689 Other abnormalities of gait and mobility: Secondary | ICD-10-CM

## 2014-12-14 NOTE — Therapy (Signed)
[facility] [address] [place], [place], [address] Phone: [phone]   Fax:  [phone]  Physical Therapy Treatment  Patient Details  Name: Natalie [name] MRN: [id number] Date of Birth: [birth year] Referring Provider:  Algernon Huxley, MD  Encounter Date: 12/14/2014      PT End of Session - 12/14/14 1022    Visit Number 6   Number of Visits 17   Date for PT Re-Evaluation 01/11/15   PT Start Time 1022   PT Stop Time 1105   PT Time Calculation (min) 43 min   Equipment Utilized During Treatment Gait belt   Activity Tolerance Patient tolerated treatment well   Behavior During Therapy [facility] for tasks assessed/performed      History reviewed. No pertinent past medical history.  History reviewed. No pertinent past surgical history.  There were no vitals filed for this visit.  Visit Diagnosis:  Abnormality of gait  Balance problems  Decreased functional activity tolerance  Status post above knee amputation of left lower extremity      Subjective Assessment - 12/14/14 1030    Subjective (p) Pt c/o socket riding too high, seeing prosthetist today at 1:00pm for adjustments. Reports walking over the weekened and navigating curbs, ramps, stairs without issues.    Currently in Pain? (p) No/denies     Prosthetic Training: Sit to stand with  Supervision  from chair with armrests x3, chair without armrests x3, in front of counter for steadying purposes if needed. Stand to sit with  Supervision  to chair with armrests x3, chair without armrests x3 Patient ambulated with Supervision Device: Rolling walker & prosthesis  Distance: 150'x1 on Indoor  Level surface.        Patient ambulated with Contact guard assist Device: axillary Crutches & prosthesis  Distance: 250'x1 on Indoor  Level surface. Multi-modal cues for deviations: gait sequence with axillary crutches, step through pattern with RLE Patient negotiated ramp with  Contact guard assist Device: Crutches & prosthesis  Patient negotiated curb with Contact guard assist Device: Crutches & prosthesis        Patient negotiated stairs with Supervision Device: One handrail and one axillary crutch & prosthesis step-to pattern       Pt performed side stepping Rand sidestepping L  supervision using countertop for steadying. Patient tolerate 8' x4 during side stepping.   PT demo technique to pick up objects from floor with prosthesis locked to aide balance. Pt return demo with light UE support to abduct / adduct either LE & supervision to actually pick up object. PT instructed safe environment to practice at home. Pt verbalized understanding.                          PT Education - 12/14/14 1022    Education provided Yes   Education Details proper gait sequence with axillary crutches on indoor level surfaces and stairs, prosthetic placement and body mechanics for lifting and lowering objects to/from floor.   Person(s) Educated Patient   Methods Explanation;Demonstration;Verbal cues   Comprehension Verbalized understanding;Returned demonstration;Verbal cues required          PT Short Term Goals - 12/09/14 0938    PT SHORT TERM GOAL #1   Title donnes prosthesis correclty modified independent. (Target Date: 12/11/2014)   Baseline Met 12/07/14   Time 4   Period Weeks   Status Achieved   PT SHORT TERM GOAL #2   Title tolerates wear of prosthesis >  8hrs total per day without skin issues or discomfort. (Target Date: 12/11/2014)   Baseline Met 12/07/14   Time 4   Period Weeks   Status Achieved   PT SHORT TERM GOAL #3   Title standing balance with rolling walker & prosthesis reaching 10" all directions, to floor, and manages clothes including toileting modified independent. (Target Date: 12/11/2014)   Time 4   Period Weeks   Status New   PT SHORT TERM GOAL #4   Title ambulates 150' with rolling walker & prosthesis with supervision. (Target Date:  12/11/2014)   Baseline Met 12/07/14   Time 4   Period Weeks   Status Achieved   PT SHORT TERM GOAL #5   Title negotiates ramp, curb with RW and stairs wtih 2 rails with prosthesis with supervision. (Target Date: 12/11/2014)   Time 4   Period Weeks   Status New           PT Long Term Goals - 11/12/14 1445    PT LONG TERM GOAL #1   Title independent with prosthetic care. (Target Date: 01/08/2015)   Time 8   Period Weeks   Status New   PT LONG TERM GOAL #2   Title toelrates wear of prosthesis >90% of awake hours without issues. (Target Date: 01/08/2015)   Time 8   Period Weeks   Status New   PT LONG TERM GOAL #3   Title Berg Balance >24/56 with prosthesis. (Target Date: 01/08/2015)   Time 8   Period Weeks   Status New   PT LONG TERM GOAL #4   Title ambulates 300' with LRAD & prosthesis modified independent. (Target Date: 01/08/2015)   Time 8   Period Weeks   Status New   PT LONG TERM GOAL #5   Title negotiates ramp, curb and stairs with LRAD modified indepenent. (Target Date: 01/08/2015)   Time 8   Period Weeks   Status New               Plan - 12/14/14 1022    Clinical Impression Statement Pt steady with axillary crutches requiring VC for sequencing. Improving balance during sit to stand activities.    Pt will benefit from skilled therapeutic intervention in order to improve on the following deficits Abnormal gait;Decreased activity tolerance;Decreased balance;Decreased endurance;Decreased knowledge of precautions;Decreased knowledge of use of DME;Decreased mobility;Decreased range of motion;Decreased strength   Rehab Potential Good   PT Frequency 2x / week   PT Duration 8 weeks   PT Treatment/Interventions ADLs/Self Care Home Management;DME Instruction;Gait training;Stair training;Functional mobility training;Therapeutic activities;Therapeutic exercise;Balance training;Neuromuscular re-education;Patient/family education;Other (comment)  prosthetic training   PT Next Visit  Plan gait with axillary crutches, balance activities   PT Home Exercise Plan midline at sink   Consulted and Agree with Plan of Care Patient;Family member/caregiver   Family Member Consulted fiance        Problem List There are no active problems to display for this patient.  Jamey Reas, PT, DPT  [address] [place], SPT 12/14/2014, 1:03 PM  [facility] [address] [place], [place], [address] Phone: [phone]   Fax:  [phone]

## 2014-12-16 ENCOUNTER — Ambulatory Visit: Payer: Medicare HMO | Admitting: Physical Therapy

## 2014-12-16 ENCOUNTER — Encounter: Payer: Self-pay | Admitting: Physical Therapy

## 2014-12-16 DIAGNOSIS — R6889 Other general symptoms and signs: Secondary | ICD-10-CM

## 2014-12-16 DIAGNOSIS — R269 Unspecified abnormalities of gait and mobility: Secondary | ICD-10-CM

## 2014-12-16 NOTE — Therapy (Signed)
[facility] [address] [place], [place], [address] Phone: [phone]   Fax:  [phone]  Physical Therapy Treatment  Patient Details  Name: Natalie [name] MRN: [id number] Date of Birth: 10/07/1955 Referring Provider:  Algernon Huxley, MD  Encounter Date: 12/16/2014      PT End of Session - 12/16/14 1023    Visit Number 7   Number of Visits 17   Date for PT Re-Evaluation 01/11/15   PT Start Time 1016   PT Stop Time 1058   PT Time Calculation (min) 42 min   Equipment Utilized During Treatment Gait belt   Activity Tolerance Patient tolerated treatment well   Behavior During Therapy [facility] for tasks assessed/performed      History reviewed. No pertinent past medical history.  History reviewed. No pertinent past surgical history.  There were no vitals filed for this visit.  Visit Diagnosis:  Abnormality of gait  Decreased functional activity tolerance      Subjective Assessment - 12/16/14 1022    Subjective No new complaints. No falls or pain to report.   Currently in Pain? No/denies           [facility] Adult PT Treatment/Exercise - 12/16/14 1023    Transfers   Sit to Stand 5: Supervision;With upper extremity assist;With armrests;From chair/3-in-1   Stand to Sit 5: Supervision;With upper extremity assist;With armrests;To chair/3-in-1   Ambulation/Gait   Ambulation/Gait Yes   Ambulation/Gait Assistance 4: Min guard;4: Min assist   Ambulation/Gait Assistance Details cues on posture, step length with gait and on sequence with crutch use.             Ambulation Distance (Feet) 100 Feet  x1, 230 feet x 1, 130 x1   Assistive device Crutches;Prosthesis   Gait Pattern Step-through pattern;Decreased step length - right;Decreased stance time - left;Decreased weight shift to left;Trunk flexed;Narrow base of support   Ambulation Surface Level;Indoor   Prosthetics   Prosthetic Care Comments  Saw prosthetist who flared  out top of socket/cut it down some so it would not push into her groin anymore. He also added pads to the socket for better fit and pt did decrease her sock ply to accomodate the pads.                              Current prosthetic wear tolerance (days/week)  dailly   Current prosthetic wear tolerance (#hours/day)  all awake hours   Residual limb condition  intact other than wound, small, healing with scant yellow eschar, scant drainage. Packed with gauze and covered with Tegaderm   Education Provided Residual limb care;Proper wear schedule/adjustment;Correct ply sock adjustment   Person(s) Educated Patient   Education Method Explanation   Education Method Verbalized understanding   Donning Prosthesis Supervision;Minimal assist  min in clinic due to pants/chair set up   [place] - 12/09/14 0938    PT SHORT TERM GOAL #1   Title donnes prosthesis correclty modified independent. (Target Date: 12/11/2014)   Baseline Met 12/07/14   Time 4   Period Weeks   Status Achieved   PT SHORT TERM GOAL #2   Title tolerates wear of prosthesis >8hrs total per day without skin issues or discomfort. (Target Date: 12/11/2014)   Baseline Met 12/07/14   Time 4   Period Weeks   Status Achieved  PT SHORT TERM GOAL #3   Title standing balance with rolling walker & prosthesis reaching 10" all directions, to floor, and manages clothes including toileting modified independent. (Target Date: 12/11/2014)   Time 4   Period Weeks   Status New   PT SHORT TERM GOAL #4   Title ambulates 150' with rolling walker & prosthesis with supervision. (Target Date: 12/11/2014)   Baseline Met 12/07/14   Time 4   Period Weeks   Status Achieved   PT SHORT TERM GOAL #5   Title negotiates ramp, curb with RW and stairs wtih 2 rails with prosthesis with supervision. (Target Date: 12/11/2014)   Time 4   Period Weeks   Status New           PT Long Term Goals - 11/12/14 1445     PT LONG TERM GOAL #1   Title independent with prosthetic care. (Target Date: 01/08/2015)   Time 8   Period Weeks   Status New   PT LONG TERM GOAL #2   Title toelrates wear of prosthesis >90% of awake hours without issues. (Target Date: 01/08/2015)   Time 8   Period Weeks   Status New   PT LONG TERM GOAL #3   Title Berg Balance >24/56 with prosthesis. (Target Date: 01/08/2015)   Time 8   Period Weeks   Status New   PT LONG TERM GOAL #4   Title ambulates 300' with LRAD & prosthesis modified independent. (Target Date: 01/08/2015)   Time 8   Period Weeks   Status New   PT LONG TERM GOAL #5   Title negotiates ramp, curb and stairs with LRAD modified indepenent. (Target Date: 01/08/2015)   Time 8   Period Weeks   Status New           Plan - 12/16/14 1023    Clinical Impression Statement Pt's balance and sequencing improving with today's session. Wound appear's to be healing. Pt progressing toward goals.   Pt will benefit from skilled therapeutic intervention in order to improve on the following deficits Abnormal gait;Decreased activity tolerance;Decreased balance;Decreased endurance;Decreased knowledge of precautions;Decreased knowledge of use of DME;Decreased mobility;Decreased range of motion;Decreased strength   Rehab Potential Good   PT Frequency 2x / week   PT Duration 8 weeks   PT Treatment/Interventions ADLs/Self Care Home Management;DME Instruction;Gait training;Stair training;Functional mobility training;Therapeutic activities;Therapeutic exercise;Balance training;Neuromuscular re-education;Patient/family education;Other (comment)  prosthetic training   PT Next Visit Plan gait with axillary crutches, balance activities   PT Home Exercise Plan midline at sink   Consulted and Agree with Plan of Care Patient;Family member/caregiver   Family Member Consulted fiance        Problem List There are no active problems to display for this patient.   Willow Ora 12/16/2014, 8:16  PM  Willow Ora, PTA, [facility] [address], [place], [place] [address] [phone] 12/16/2014, 8:16 PM

## 2014-12-21 ENCOUNTER — Ambulatory Visit: Payer: Medicare HMO | Admitting: Physical Therapy

## 2014-12-23 ENCOUNTER — Ambulatory Visit: Payer: Medicare HMO | Admitting: Physical Therapy

## 2014-12-23 ENCOUNTER — Encounter: Payer: Self-pay | Admitting: Physical Therapy

## 2014-12-23 DIAGNOSIS — R269 Unspecified abnormalities of gait and mobility: Secondary | ICD-10-CM

## 2014-12-23 DIAGNOSIS — R2689 Other abnormalities of gait and mobility: Secondary | ICD-10-CM

## 2014-12-23 DIAGNOSIS — R6889 Other general symptoms and signs: Secondary | ICD-10-CM

## 2014-12-24 NOTE — Therapy (Signed)
[facility] [address] [place], [place], [address] Phone: [phone]   Fax:  [phone]  Physical Therapy Treatment  Patient Details  Name: Natalie [name] MRN: [id number] Date of Birth: 02/09/1956 Referring Provider:  Algernon Huxley, MD  Encounter Date: 12/23/2014      PT End of Session - 12/23/14 1018    Visit Number 8   Number of Visits 17   Date for PT Re-Evaluation 01/11/15   PT Start Time 1016   PT Stop Time 1058   PT Time Calculation (min) 42 min   Equipment Utilized During Treatment Gait belt   Activity Tolerance Patient tolerated treatment well   Behavior During Therapy [facility] for tasks assessed/performed      History reviewed. No pertinent past medical history.  History reviewed. No pertinent past surgical history.  There were no vitals filed for this visit.  Visit Diagnosis:  Abnormality of gait  Decreased functional activity tolerance  Balance problems      Subjective Assessment - 12/23/14 1018    Subjective No new complaints. No falls or pain to report.   Currently in Pain? No/denies            [facility] Adult PT Treatment/Exercise - 12/23/14 1019    Transfers   Sit to Stand 5: Supervision;With upper extremity assist;With armrests;From chair/3-in-1   Stand to Sit 5: Supervision;With upper extremity assist;With armrests;To chair/3-in-1   Ambulation/Gait   Ambulation/Gait Yes   Ambulation/Gait Assistance 4: Min guard   Ambulation/Gait Assistance Details cues on posture, for equal step length and sequence with crutches   Ambulation Distance (Feet) 230 Feet   Assistive device Crutches;Prosthesis   Gait Pattern Step-through pattern;Decreased step length - right;Decreased stance time - left;Decreased weight shift to left;Trunk flexed;Narrow base of support   Ambulation Surface Level;Indoor   Dynamic Standing Balance   Dynamic Standing - Balance Support No upper extremity supported;During  functional activity   Dynamic Standing - Level of Assistance 4: Min assist  to min guard assist   Dynamic Standing - Balance Activities Reaching for objects;Other (comment)  dowel rod;theraband;bean bags   Dynamic Standing - Comments standing by mat with walker in front of her: with dowel rod UE raises x 10, upper trunk rotation x 10 each way; with red theraband rows and shoulder extension x 10 each; bean bag toss forward to basket on high surface with pt turning and reaching laterally to elevated mat (bean bags scattered to varied distances from pt to encourage reaching).                             Prosthetics   Prosthetic Care Comments  Added socks today due to pressure, had improvement.   Current prosthetic wear tolerance (days/week)  dailly   Current prosthetic wear tolerance (#hours/day)  all awake hours   Residual limb condition  intact per patient.    Education Provided Residual limb care;Correct ply sock adjustment   Person(s) Educated Patient   Education Method Explanation   Education Method Verbalized understanding   Donning Prosthesis Supervision   Doffing Prosthesis Supervision           PT Short Term Goals - 12/09/14 [phone]    PT SHORT TERM GOAL #1   Title donnes prosthesis correclty modified independent. (Target Date: 12/11/2014)   Baseline Met 12/07/14   Time 4   Period Weeks   Status Achieved   PT SHORT TERM GOAL #2  Title tolerates wear of prosthesis >8hrs total per day without skin issues or discomfort. (Target Date: 12/11/2014)   Baseline Met 12/07/14   Time 4   Period Weeks   Status Achieved   PT SHORT TERM GOAL #3   Title standing balance with rolling walker & prosthesis reaching 10" all directions, to floor, and manages clothes including toileting modified independent. (Target Date: 12/11/2014)   Time 4   Period Weeks   Status New   PT SHORT TERM GOAL #4   Title ambulates 150' with rolling walker & prosthesis with supervision. (Target Date: 12/11/2014)   Baseline  Met 12/07/14   Time 4   Period Weeks   Status Achieved   PT SHORT TERM GOAL #5   Title negotiates ramp, curb with RW and stairs wtih 2 rails with prosthesis with supervision. (Target Date: 12/11/2014)   Time 4   Period Weeks   Status New           PT Long Term Goals - 11/12/14 1445    PT LONG TERM GOAL #1   Title independent with prosthetic care. (Target Date: 01/08/2015)   Time 8   Period Weeks   Status New   PT LONG TERM GOAL #2   Title toelrates wear of prosthesis >90% of awake hours without issues. (Target Date: 01/08/2015)   Time 8   Period Weeks   Status New   PT LONG TERM GOAL #3   Title Berg Balance >24/56 with prosthesis. (Target Date: 01/08/2015)   Time 8   Period Weeks   Status New   PT LONG TERM GOAL #4   Title ambulates 300' with LRAD & prosthesis modified independent. (Target Date: 01/08/2015)   Time 8   Period Weeks   Status New   PT LONG TERM GOAL #5   Title negotiates ramp, curb and stairs with LRAD modified indepenent. (Target Date: 01/08/2015)   Time 8   Period Weeks   Status New           Plan - 12/23/14 1018    Clinical Impression Statement Pt continues to progress towards goals. Increased gait distance with crutches today before needing a rest break.    Pt will benefit from skilled therapeutic intervention in order to improve on the following deficits Abnormal gait;Decreased activity tolerance;Decreased balance;Decreased endurance;Decreased knowledge of precautions;Decreased knowledge of use of DME;Decreased mobility;Decreased range of motion;Decreased strength   Rehab Potential Good   PT Frequency 2x / week   PT Duration 8 weeks   PT Treatment/Interventions ADLs/Self Care Home Management;DME Instruction;Gait training;Stair training;Functional mobility training;Therapeutic activities;Therapeutic exercise;Balance training;Neuromuscular re-education;Patient/family education;Other (comment)  prosthetic training   PT Next Visit Plan gait with axillary  crutches, balance activities. assess wound   PT Home Exercise Plan midline at sink   Consulted and Agree with Plan of Care Patient;Family member/caregiver   Family Member Consulted fiance        Problem List There are no active problems to display for this patient.   Willow Ora 12/24/2014, 5:37 PM  Willow Ora, PTA, [facility] [address], [place], [place] [address] [phone] 12/24/2014, 5:37 PM

## 2014-12-28 ENCOUNTER — Encounter: Payer: Self-pay | Admitting: Physical Therapy

## 2014-12-28 ENCOUNTER — Ambulatory Visit: Payer: Medicare HMO | Admitting: Physical Therapy

## 2014-12-28 DIAGNOSIS — R269 Unspecified abnormalities of gait and mobility: Secondary | ICD-10-CM | POA: Diagnosis not present

## 2014-12-28 DIAGNOSIS — R2689 Other abnormalities of gait and mobility: Secondary | ICD-10-CM

## 2014-12-28 DIAGNOSIS — Z89612 Acquired absence of left leg above knee: Secondary | ICD-10-CM

## 2014-12-28 DIAGNOSIS — R6889 Other general symptoms and signs: Secondary | ICD-10-CM

## 2014-12-28 NOTE — Therapy (Signed)
[facility] [address] [place], [place], [address] Phone: [phone]   Fax:  [phone]  Physical Therapy Treatment  Patient Details  Name: Natalie [name] MRN: [id number] Date of Birth: 1955-08-18 Referring Provider:  Algernon Huxley, MD  Encounter Date: 12/28/2014      PT End of Session - 12/28/14 1013    Visit Number 9   Number of Visits 17   Date for PT Re-Evaluation 01/11/15   PT Start Time 1013   PT Stop Time 1055   PT Time Calculation (min) 42 min   Equipment Utilized During Treatment Gait belt   Activity Tolerance Patient tolerated treatment well   Behavior During Therapy [facility] for tasks assessed/performed      History reviewed. No pertinent past medical history.  History reviewed. No pertinent past surgical history.  There were no vitals filed for this visit.  Visit Diagnosis:  Abnormality of gait  Decreased functional activity tolerance  Balance problems  Status post above knee amputation of left lower extremity      Subjective Assessment - 12/28/14 1153    Subjective Pt reports walking a lot in the home this morning prior to PT session. Is using axillary crutches and RW inside the home. No new complaints or issues.    Currently in Pain? No/denies                         [facility] Adult PT Treatment/Exercise - 12/28/14 1013    Transfers   Sit to Stand 5: Supervision;With upper extremity assist;With armrests;From chair/3-in-1   Sit to Stand Details (indicate cue type and reason) No verbal cues needed this date   Stand to Sit 5: Supervision;With upper extremity assist;With armrests;To chair/3-in-1   Stand to Sit Details no cues needed this date   Ambulation/Gait   Ambulation/Gait Yes   Ambulation/Gait Assistance 5: Supervision   Ambulation/Gait Assistance Details Supervision when amb with RW. SBA when amb with axillary crutches. MinG when amb with SBQC. Pt had to episodes of LOB,  no falls and able to correct   Ambulation Distance (Feet) 165 Feet  w crutches; 115 and 140' w RW; 20 and 96' w [facility]   Assistive device Rolling walker;Crutches;Small based quad cane;Prosthesis   Gait Pattern Step-through pattern;Decreased step length - right;Decreased stance time - left;Decreased weight shift to left;Trunk flexed;Narrow base of support   Ambulation Surface Level;Indoor   Stairs No   Ramp 5: Supervision   Ramp Details (indicate cue type and reason) Use of axillary crutches and prosthesis. Cues for crutch width and placement when negotiating step.   Curb 5: Supervision   Curb Details (indicate cue type and reason) Use of axillary crutches and prosthesis, cues for manipulation of prosthestic knee during descent.    Dynamic Standing Balance   Dynamic Standing - Balance Support No upper extremity supported;During functional activity   Dynamic Standing - Level of Assistance 5: Stand by assistance   Dynamic Standing - Balance Activities Lateral lean/weight shifting;Forward lean/weight shifting;Eyes open;Other (comment)  Sweeping   Dynamic Standing - Comments verbal cues for weight shift onto prosthesis   High Level Balance   High Level Balance Activities Side stepping;Backward walking   High Level Balance Comments one hand on countertop   Prosthetics   Prosthetic Care Comments  --  PT added heel wedge to R shoe. Will reassess next session   Current prosthetic wear tolerance (days/week)  dailly   Current prosthetic wear tolerance (#  hours/day)  all awake hours   Residual limb condition  intact per patient.    Education Provided Other (comment)  step length on L for proper weight bearing through knee   Person(s) Educated Patient   Education Method Explanation;Demonstration;Verbal cues   Education Method Verbalized understanding;Returned demonstration;Verbal cues required;Needs further instruction  whem amb with SBQC   Donning Prosthesis --  not tested   Doffing Prosthesis --   not tested                  PT Short Term Goals - 12/09/14 3244    PT SHORT TERM GOAL #1   Title donnes prosthesis correclty modified independent. (Target Date: 12/11/2014)   Baseline Met 12/07/14   Time 4   Period Weeks   Status Achieved   PT SHORT TERM GOAL #2   Title tolerates wear of prosthesis >8hrs total per day without skin issues or discomfort. (Target Date: 12/11/2014)   Baseline Met 12/07/14   Time 4   Period Weeks   Status Achieved   PT SHORT TERM GOAL #3   Title standing balance with rolling walker & prosthesis reaching 10" all directions, to floor, and manages clothes including toileting modified independent. (Target Date: 12/11/2014)   Time 4   Period Weeks   Status New   PT SHORT TERM GOAL #4   Title ambulates 150' with rolling walker & prosthesis with supervision. (Target Date: 12/11/2014)   Baseline Met 12/07/14   Time 4   Period Weeks   Status Achieved   PT SHORT TERM GOAL #5   Title negotiates ramp, curb with RW and stairs wtih 2 rails with prosthesis with supervision. (Target Date: 12/11/2014)   Time 4   Period Weeks   Status New           PT Long Term Goals - 11/12/14 1445    PT LONG TERM GOAL #1   Title independent with prosthetic care. (Target Date: 01/08/2015)   Time 8   Period Weeks   Status New   PT LONG TERM GOAL #2   Title toelrates wear of prosthesis >90% of awake hours without issues. (Target Date: 01/08/2015)   Time 8   Period Weeks   Status New   PT LONG TERM GOAL #3   Title Berg Balance >24/56 with prosthesis. (Target Date: 01/08/2015)   Time 8   Period Weeks   Status New   PT LONG TERM GOAL #4   Title ambulates 300' with LRAD & prosthesis modified independent. (Target Date: 01/08/2015)   Time 8   Period Weeks   Status New   PT LONG TERM GOAL #5   Title negotiates ramp, curb and stairs with LRAD modified indepenent. (Target Date: 01/08/2015)   Time 8   Period Weeks   Status New               Plan - 12/28/14 1013     Clinical Impression Statement Pt's endurance is good and able to tolerate 30 minutes of PT activities before needing a rest break. Progressed to use of SBQC with 2 episodes of LOB but no falls, took too large of step on L and unable to take weight through the knee.    Pt will benefit from skilled therapeutic intervention in order to improve on the following deficits Abnormal gait;Decreased activity tolerance;Decreased balance;Decreased endurance;Decreased knowledge of precautions;Decreased knowledge of use of DME;Decreased mobility;Decreased range of motion;Decreased strength   Rehab Potential Good   PT Frequency 2x /  week   PT Duration 8 weeks   PT Treatment/Interventions ADLs/Self Care Home Management;DME Instruction;Gait training;Stair training;Functional mobility training;Therapeutic activities;Therapeutic exercise;Balance training;Neuromuscular re-education;Patient/family education;Other (comment)  prosthetic training   PT Next Visit Plan Continue to progress with [facility]   PT Home Exercise Plan midline at sink   Consulted and Agree with Plan of Care Patient;Family member/caregiver   Family Member Consulted fiance        Problem List There are no active problems to display for this patient.   Jamey Reas, PT, DPT PT Specializing in [place] 12/28/2014 4:43 PM Phone:  [phone]  Fax:  [phone] [facility] [address] [place], [place] [address]  Katalia Choma Blair Janyah [place], [place] 12/28/2014, 12:11 PM  [facility] [address] [place], [place], [address] Phone: [phone]   Fax:  [phone]

## 2014-12-30 ENCOUNTER — Encounter: Payer: Self-pay | Admitting: Physical Therapy

## 2014-12-30 ENCOUNTER — Ambulatory Visit: Payer: Medicare HMO | Admitting: Physical Therapy

## 2014-12-30 DIAGNOSIS — R269 Unspecified abnormalities of gait and mobility: Secondary | ICD-10-CM

## 2014-12-30 DIAGNOSIS — R6889 Other general symptoms and signs: Secondary | ICD-10-CM

## 2014-12-30 NOTE — Therapy (Signed)
[facility] [address] [place], [place], [address] Phone: [phone]   Fax:  [phone]  Physical Therapy Treatment  Patient Details  Name: Natalie [name] MRN: [id number] Date of Birth: 07-May-1956 Referring Provider:  Algernon Huxley, MD  Encounter Date: 12/30/2014      PT End of Session - 12/30/14 1025    Visit Number 10   Number of Visits 17   Date for PT Re-Evaluation 01/11/15   PT Start Time 2263   PT Stop Time 1058   PT Time Calculation (min) 40 min   Equipment Utilized During Treatment Gait belt   Activity Tolerance Patient tolerated treatment well   Behavior During Therapy [facility] for tasks assessed/performed      History reviewed. No pertinent past medical history.  History reviewed. No pertinent past surgical history.  There were no vitals filed for this visit.  Visit Diagnosis:  Abnormality of gait  Decreased functional activity tolerance      Subjective Assessment - 12/30/14 1025    Subjective No new complaints. No falls or pain to report.   Currently in Pain? No/denies           [facility] Adult PT Treatment/Exercise - 12/30/14 1026    Transfers   Sit to Stand 5: Supervision;With upper extremity assist;With armrests;From chair/3-in-1   Stand to Sit 5: Supervision;With upper extremity assist;With armrests;To chair/3-in-1   Ambulation/Gait   Ambulation/Gait Yes   Ambulation/Gait Assistance 5: Supervision   Ambulation/Gait Assistance Details cues on prosthetic placement with stance and with intital contact after swing phase. Pt with tendency to abduct prostheis with advancement and then have prosthesis to centered with intital contact into stance phase.  cues on posture as well.                       Ambulation Distance (Feet) 100 Feet  x1, 220 x1    Assistive device Rolling walker;Prosthesis   Gait Pattern Step-through pattern;Decreased step length - right;Decreased stance time - left;Decreased  weight shift to left;Trunk flexed;Narrow base of support   Ambulation Surface Level;Indoor   Prosthetics   Prosthetic Care Comments  pt needed cues to correctly don liner with strap in proper place and to correctly don socks so not to have them loose/bunched at end of limb. Most of session spent on ensuring prosthesis is being donned correctly. Pt with decreased prosthetic control on arrival with gait, this improved after redonning prosthesis during session. Pt also edcuated on how to change shoes and to keep the heel height the same with new shoes so not to affect the knee control on the prosthesis.                        Current prosthetic wear tolerance (days/week)  dailly   Current prosthetic wear tolerance (#hours/day)  all awake hours   Residual limb condition  visualized. pt's wound now smaller. "pin prick" size hole, 0.5 cm depth with no tunneling or undermining palpated on exam. packed with small 2x2 strip and covered with Tegaderm. clean margins and no odor present.                             Education Provided Residual limb care  changing shoes, donning liner/socks   Person(s) Educated Patient   Education Method Explanation;Verbal cues   Education Method Verbalized understanding   Donning Prosthesis Supervision   Doffing  Prosthesis Supervision           PT Short Term Goals - 12/09/14 1610    PT SHORT TERM GOAL #1   Title donnes prosthesis correclty modified independent. (Target Date: 12/11/2014)   Baseline Met 12/07/14   Time 4   Period Weeks   Status Achieved   PT SHORT TERM GOAL #2   Title tolerates wear of prosthesis >8hrs total per day without skin issues or discomfort. (Target Date: 12/11/2014)   Baseline Met 12/07/14   Time 4   Period Weeks   Status Achieved   PT SHORT TERM GOAL #3   Title standing balance with rolling walker & prosthesis reaching 10" all directions, to floor, and manages clothes including toileting modified independent. (Target Date: 12/11/2014)   Time 4    Period Weeks   Status New   PT SHORT TERM GOAL #4   Title ambulates 150' with rolling walker & prosthesis with supervision. (Target Date: 12/11/2014)   Baseline Met 12/07/14   Time 4   Period Weeks   Status Achieved   PT SHORT TERM GOAL #5   Title negotiates ramp, curb with RW and stairs wtih 2 rails with prosthesis with supervision. (Target Date: 12/11/2014)   Time 4   Period Weeks   Status New           PT Long Term Goals - 11/12/14 1445    PT LONG TERM GOAL #1   Title independent with prosthetic care. (Target Date: 01/08/2015)   Time 8   Period Weeks   Status New   PT LONG TERM GOAL #2   Title toelrates wear of prosthesis >90% of awake hours without issues. (Target Date: 01/08/2015)   Time 8   Period Weeks   Status New   PT LONG TERM GOAL #3   Title Berg Balance >24/56 with prosthesis. (Target Date: 01/08/2015)   Time 8   Period Weeks   Status New   PT LONG TERM GOAL #4   Title ambulates 300' with LRAD & prosthesis modified independent. (Target Date: 01/08/2015)   Time 8   Period Weeks   Status New   PT LONG TERM GOAL #5   Title negotiates ramp, curb and stairs with LRAD modified indepenent. (Target Date: 01/08/2015)   Time 8   Period Weeks   Status New           Plan - 12/30/14 1025    Clinical Impression Statement Pt now with better understanding of how strap placement effects the prosthetic control/alingment with gait. Progressing toward goals.    Pt will benefit from skilled therapeutic intervention in order to improve on the following deficits Abnormal gait;Decreased activity tolerance;Decreased balance;Decreased endurance;Decreased knowledge of precautions;Decreased knowledge of use of DME;Decreased mobility;Decreased range of motion;Decreased strength   Rehab Potential Good   PT Frequency 2x / week   PT Duration 8 weeks   PT Treatment/Interventions ADLs/Self Care Home Management;DME Instruction;Gait training;Stair training;Functional mobility training;Therapeutic  activities;Therapeutic exercise;Balance training;Neuromuscular re-education;Patient/family education;Other (comment)  prosthetic training   PT Next Visit Plan  Continue to progress with SBQC, balance activities   Consulted and Agree with Plan of Care Patient;Family member/caregiver   Family Member Consulted fiance        Problem List There are no active problems to display for this patient.   Willow Ora 12/30/2014, 6:26 PM  Willow Ora, PTA, [facility] [address], [place], [place] [address] [phone] 12/30/2014, 6:26 PM

## 2015-01-05 ENCOUNTER — Encounter: Payer: Self-pay | Admitting: Physical Therapy

## 2015-01-05 ENCOUNTER — Ambulatory Visit: Payer: Medicare HMO | Admitting: Physical Therapy

## 2015-01-05 DIAGNOSIS — R269 Unspecified abnormalities of gait and mobility: Secondary | ICD-10-CM

## 2015-01-05 DIAGNOSIS — R531 Weakness: Secondary | ICD-10-CM

## 2015-01-05 DIAGNOSIS — R2689 Other abnormalities of gait and mobility: Secondary | ICD-10-CM

## 2015-01-05 DIAGNOSIS — R6889 Other general symptoms and signs: Secondary | ICD-10-CM

## 2015-01-05 DIAGNOSIS — Z89612 Acquired absence of left leg above knee: Secondary | ICD-10-CM

## 2015-01-05 NOTE — Therapy (Signed)
[facility] [address] [place], [place], [address] Phone: [phone]   Fax:  [phone]  Physical Therapy Treatment  Patient Details  Name: Natalie [name] MRN: [id number] Date of Birth: Jan 11, 1956 Referring Provider:  Algernon Huxley, MD  Encounter Date: 01/05/2015      PT End of Session - 01/05/15 1015    Visit Number 11   Number of Visits 20   Date for PT Re-Evaluation 03/05/15   PT Start Time 2505   PT Stop Time 1100   PT Time Calculation (min) 45 min   Equipment Utilized During Treatment Gait belt   Activity Tolerance Patient tolerated treatment well   Behavior During Therapy [facility] for tasks assessed/performed      History reviewed. No pertinent past medical history.  History reviewed. No pertinent past surgical history.  There were no vitals filed for this visit.  Visit Diagnosis:  Abnormality of gait  Decreased functional activity tolerance  Balance problems  Status post above knee amputation of left lower extremity  Generalized weakness      Subjective Assessment - 01/05/15 1021    Subjective No new complaints. No falls or pain to report. Used cane with family some.   Currently in Pain? No/denies            [facility] PT Assessment - 01/05/15 1015    Berg Balance Test   Sit to Stand Able to stand  independently using hands   Standing Unsupported Able to stand safely 2 minutes   Sitting with Back Unsupported but Feet Supported on Floor or Stool Able to sit safely and securely 2 minutes   Stand to Sit Controls descent by using hands   Transfers Able to transfer safely, definite need of hands   Standing Unsupported with Eyes Closed Able to stand 10 seconds with supervision   Standing Ubsupported with Feet Together Able to place feet together independently and stand for 1 minute with supervision   From Standing, Reach Forward with Outstretched Arm Can reach forward >5 cm safely (2")   From  Standing Position, Pick up Object from Floor Able to pick up shoe, needs supervision   From Standing Position, Turn to Look Behind Over each Shoulder Looks behind one side only/other side shows less weight shift   Turn 360 Degrees Needs assistance while turning   Standing Unsupported, Alternately Place Feet on Step/Stool Needs assistance to keep from falling or unable to try   Standing Unsupported, One Foot in [employer] balance while stepping or standing   Standing on One Leg Tries to lift leg/unable to hold 3 seconds but remains standing independently   Total Score 32                     [facility] Adult PT Treatment/Exercise - 01/05/15 1015    Transfers   Sit to Stand With upper extremity assist;With armrests;From chair/3-in-1;6: Modified independent (Device/Increase time)   Stand to Sit With upper extremity assist;With armrests;To chair/3-in-1;6: Modified independent (Device/Increase time)   Ambulation/Gait   Ambulation/Gait Yes   Ambulation/Gait Assistance 4: Min assist;5: Supervision  Min A initially progressed to SBA with wall support   Ambulation/Gait Assistance Details demo & verbal cues on posture, wt shift and sequence   Ambulation Distance (Feet) 450 Feet  X1,    Assistive device Prosthesis;Straight cane;Crutches  tripod tip, hand support on wall; modified ind. w/ cructhes   Gait Pattern Step-through pattern;Decreased step length - right;Decreased stance time - left;Decreased  weight shift to left;Trunk flexed;Narrow base of support   Ambulation Surface Indoor;Level   Stairs Yes   Stairs Assistance 5: Supervision   Stairs Assistance Details (indicate cue type and reason) cues on wt shift over prosthesis in stance   Stair Management Technique One rail Left;Step to pattern;With cane   Number of Stairs 4  2 reps   Ramp 5: Supervision;4: Min assist  5: crutches; 4: cane   Ramp Details (indicate cue type and reason) wt shift over prosthesis   Curb 5: Supervision;4: Min  assist  5: crutches; 4: cane   Curb Details (indicate cue type and reason) cues on sequence, step length & stance wt shift   Prosthetics   Prosthetic Care Comments  --   Current prosthetic wear tolerance (days/week)  dailly   Current prosthetic wear tolerance (#hours/day)  all awake hours   Residual limb condition  visualized. pt's wound now smaller. "pin prick" size hole, 0.5 cm depth with no tunneling or undermining palpated on exam. packed with small 2x2 strip and covered with Tegaderm. clean margins and no odor present.                             Person(s) Educated Patient   Education Method Explanation   Education Method Verbalized understanding   Donning Prosthesis Modified independent (device/increased time)   Doffing Prosthesis Modified independent (device/increased time)                  PT Short Term Goals - 01/05/15 1015    PT SHORT TERM GOAL #1   Title donnes prosthesis correclty modified independent. (Target Date: 12/11/2014)   Baseline Met 12/07/14   Time 4   Period Weeks   Status Achieved   PT SHORT TERM GOAL #2   Title tolerates wear of prosthesis >8hrs total per day without skin issues or discomfort. (Target Date: 12/11/2014)   Baseline Met 12/07/14   Time 4   Period Weeks   Status Achieved   PT SHORT TERM GOAL #3   Title standing balance with rolling walker & prosthesis reaching 10" all directions, to floor, and manages clothes including toileting modified independent. (Target Date: 12/11/2014)   Time 4   Period Weeks   Status Achieved   PT SHORT TERM GOAL #4   Title ambulates 150' with rolling walker & prosthesis with supervision. (Target Date: 12/11/2014)   Baseline Met 12/07/14   Time 4   Period Weeks   Status Achieved   PT SHORT TERM GOAL #5   Title negotiates ramp, curb with RW and stairs wtih 2 rails with prosthesis with supervision. (Target Date: 12/11/2014)   Time 4   Period Weeks   Status Achieved   Additional Short Term Goals   Additional Short  Term Goals Yes   PT SHORT TERM GOAL #6   Title Reaches 10" without UE assist safely. (Target Date: 02/03/2025)   Time 4   Period Weeks   Status New   PT SHORT TERM GOAL #7   Title ambulates 300' with single point cane & prosthesis with contact assist. (Target Date: 02/03/2025)   Time 4   Period Weeks   Status New   PT SHORT TERM GOAL #8   Title negotiates ramp & curb with single point cane & prosthesis with minA. (Target Date: 02/03/2025)   Time 4   Period Weeks   Status New  PT Long Term Goals - 01/05/15 1015    PT LONG TERM GOAL #1   Title independent with prosthetic care. (Target Date: 01/08/2015)   Baseline MET 01/05/15   Time 8   Period Weeks   Status Achieved   PT LONG TERM GOAL #2   Title toelrates wear of prosthesis >90% of awake hours without issues. (NEW Target Date: 03/05/2015)   Baseline Partially MET 01/05/15 patient tolerates wear >90% of awake hours but has wound still present that is slowly healing.   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 (Target Date: 01/08/2015)   Baseline MET 01/05/15   Time 8   Period Weeks   Status Achieved   PT LONG TERM GOAL #4   Title ambulates 300' with single point cane & prosthesis modified independent. (NEW Target Date: 03/05/2015)   Baseline MET with crutches on 01/05/15; goal updated to single point cane   Time 8   Period Weeks   Status Revised   PT LONG TERM GOAL #5   Title negotiates ramp, curb and stairs with single point cane & prosthesis modified indepenent. (NEW Target Date: 03/05/2015)   Baseline Partially MET 01/05/15 patient is modified independent with RW; SBA with crutches & minA with cane.  Updated to single point cane.   Time 8   Period Weeks   Status Revised               Plan - 01/05/15 1015    Clinical Impression Statement Patient has progressed towards LTGs. She is able to ambulate in community with crutches with her prosthesis  now. She has the potential to ambulate in community with single point cane with further training. Patient is able to perfrom acitivities at home with instruction. Patient should be able to further progress mobility with weekly instruction of activities.   Pt will benefit from skilled therapeutic intervention in order to improve on the following deficits Abnormal gait;Decreased activity tolerance;Decreased balance;Decreased endurance;Decreased knowledge of precautions;Decreased knowledge of use of DME;Decreased mobility;Decreased range of motion;Decreased strength;Prosthetic Dependency   Rehab Potential Good   PT Frequency 1x / week   PT Duration 8 weeks   PT Treatment/Interventions ADLs/Self Care Home Management;DME Instruction;Gait training;Stair training;Functional mobility training;Therapeutic activities;Therapeutic exercise;Balance training;Neuromuscular re-education;Patient/family education;Other (comment)  prosthetic training   PT Next Visit Plan continue with single point cane towards updated STGs & LTGs.   Consulted and Agree with Plan of Care Patient        Problem List There are no active problems to display for this patient.   Jamey Reas PT, DPT 01/05/2015, 8:46 PM  [facility] [address] [place], [place], [address] Phone: [phone]   Fax:  [phone]

## 2015-01-07 ENCOUNTER — Encounter: Payer: Self-pay | Admitting: Physical Therapy

## 2015-01-07 ENCOUNTER — Ambulatory Visit: Payer: Medicare HMO | Attending: Vascular Surgery | Admitting: Physical Therapy

## 2015-01-07 DIAGNOSIS — R6889 Other general symptoms and signs: Secondary | ICD-10-CM | POA: Diagnosis present

## 2015-01-07 DIAGNOSIS — Z89612 Acquired absence of left leg above knee: Secondary | ICD-10-CM | POA: Diagnosis present

## 2015-01-07 DIAGNOSIS — R2689 Other abnormalities of gait and mobility: Secondary | ICD-10-CM

## 2015-01-07 DIAGNOSIS — R531 Weakness: Secondary | ICD-10-CM | POA: Diagnosis present

## 2015-01-07 DIAGNOSIS — R29818 Other symptoms and signs involving the nervous system: Secondary | ICD-10-CM | POA: Diagnosis present

## 2015-01-07 DIAGNOSIS — R269 Unspecified abnormalities of gait and mobility: Secondary | ICD-10-CM

## 2015-01-10 NOTE — Therapy (Signed)
[facility] [address] [place], [place], [address] Phone: [phone]   Fax:  [phone]  Physical Therapy Treatment  Patient Details  Name: Natalie [name] MRN: [id number] Date of Birth: [birth year] Referring Provider:  Algernon Huxley, MD  Encounter Date: 01/07/2015    01/07/15 1024  PT Visits / Re-Eval  Visit Number 12  Number of Visits 20  Date for PT Re-Evaluation 03/05/15  PT Time Calculation  PT Start Time 1017  PT Stop Time 1100  PT Time Calculation (min) 43 min  PT - End of Session  Equipment Utilized During Treatment Gait belt  Activity Tolerance Patient tolerated treatment well  Behavior During Therapy [facility] for tasks assessed/performed      01/07/15 1024  Symptoms/Limitations  Subjective No new complaints. No falls or pain to report.   Pain Assessment  Currently in Pain? No/denies    History reviewed. No pertinent past medical history.  History reviewed. No pertinent past surgical history.  There were no vitals filed for this visit.  Visit Diagnosis:  Abnormality of gait  Decreased functional activity tolerance  Balance problems     01/07/15 1026  Ambulation/Gait  Ambulation/Gait Yes  Ambulation/Gait Assistance 4: Min assist  Ambulation/Gait Assistance Details pt with decreased control of prosthesis with gait on arrival and gait into gym. Had pt remove prosthesis and all. It was found that pt had her shrinker on under her liner because of the liner "not being comfortable on her skin".   Ambulation Distance (Feet) 100 Feet (x1, 30 x1 with crutches: 12 feet x 6 laps with cane)  Assistive device Prosthesis;Straight cane;Crutches  Gait Pattern Step-through pattern;Decreased step length - right;Decreased stance time - left;Decreased weight shift to left;Trunk flexed;Narrow base of support  Ambulation Surface Level;Indoor  Dynamic Standing Balance  Dynamic Standing - Balance Support No upper  extremity supported;During functional activity  Dynamic Standing - Level of Assistance 4: Min assist  Dynamic Standing - Balance Activities Compliant surfaces;Eyes closed;Head turns;Head nods  Dynamic Standing - Comments standing on air ex in parallel bars: with little to no UE support- eyes closed no head movements, eyes closed head nods/shakes.                                          Prosthetics  Prosthetic Care Comments  pt with decreased prosthetic control on arrival to session today. had pt remove prosthesis and liner. found that pt had her shrinker on under the liner. Ecducated pt this was unsafe due to liner not being secured and could slide off. that the silicone needs to adhere to her skin to keep it in place and secure for gait. Pt verbalized understanding. Improved control noted with gait after redonning the liner and prosthesis.                       Current prosthetic wear tolerance (days/week)  dailly  Current prosthetic wear tolerance (#hours/day)  all awake hours  Residual limb condition  visualized. pt's wound now smaller. "pin prick" size hole, 0.5 cm depth with no tunneling or undermining palpated on exam. packed with small 2x2 strip and covered with Tegaderm. clean margins and no odor present.                           (pt educated on proper  packing technique for home management)  Education Provided Residual limb care;Correct ply sock adjustment;Other (comment) (not to wear shrinker under liner;use of deodorant/baby oil)  Person(s) Educated Patient  Education Method Explanation;Verbal cues  Education Method Verbalized understanding;Needs further instruction  Donning Prosthesis 6  Doffing Prosthesis 6         PT Short Term Goals - 01/05/15 1015    PT SHORT TERM GOAL #1   Title donnes prosthesis correclty modified independent. (Target Date: 12/11/2014)   Baseline Met 12/07/14   Time 4   Period Weeks   Status Achieved   PT SHORT TERM GOAL #2   Title tolerates wear of  prosthesis >8hrs total per day without skin issues or discomfort. (Target Date: 12/11/2014)   Baseline Met 12/07/14   Time 4   Period Weeks   Status Achieved   PT SHORT TERM GOAL #3   Title standing balance with rolling walker & prosthesis reaching 10" all directions, to floor, and manages clothes including toileting modified independent. (Target Date: 12/11/2014)   Time 4   Period Weeks   Status Achieved   PT SHORT TERM GOAL #4   Title ambulates 150' with rolling walker & prosthesis with supervision. (Target Date: 12/11/2014)   Baseline Met 12/07/14   Time 4   Period Weeks   Status Achieved   PT SHORT TERM GOAL #5   Title negotiates ramp, curb with RW and stairs wtih 2 rails with prosthesis with supervision. (Target Date: 12/11/2014)   Time 4   Period Weeks   Status Achieved   Additional Short Term Goals   Additional Short Term Goals Yes   PT SHORT TERM GOAL #6   Title Reaches 10" without UE assist safely. (Target Date: 02/03/2025)   Time 4   Period Weeks   Status New   PT SHORT TERM GOAL #7   Title ambulates 300' with single point cane & prosthesis with contact assist. (Target Date: 02/03/2025)   Time 4   Period Weeks   Status New   PT SHORT TERM GOAL #8   Title negotiates ramp & curb with single point cane & prosthesis with minA. (Target Date: 02/03/2025)   Time 4   Period Weeks   Status New           PT Long Term Goals - 01/05/15 1015    PT LONG TERM GOAL #1   Title independent with prosthetic care. (Target Date: 01/08/2015)   Baseline MET 01/05/15   Time 8   Period Weeks   Status Achieved   PT LONG TERM GOAL #2   Title toelrates wear of prosthesis >90% of awake hours without issues. (NEW Target Date: 03/05/2015)   Baseline Partially MET 01/05/15 patient tolerates wear >90% of awake hours but has wound still present that is slowly healing.   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 (Target Date: 01/08/2015)    Baseline MET 01/05/15   Time 8   Period Weeks   Status Achieved   PT LONG TERM GOAL #4   Title ambulates 300' with single point cane & prosthesis modified independent. (NEW Target Date: 03/05/2015)   Baseline MET with crutches on 01/05/15; goal updated to single point cane   Time 8   Period Weeks   Status Revised   PT LONG TERM GOAL #5   Title negotiates ramp, curb and stairs with single point cane & prosthesis  modified indepenent. (NEW Target Date: 03/05/2015)   Baseline Partially MET 01/05/15 patient is modified independent with RW; SBA with crutches & minA with cane.  Updated to single point cane.   Time 8   Period Weeks   Status Revised        01/07/15 1025  Plan  Clinical Impression Statement Pt making steady progress with mobility and balance. Still needs cues on prosthetic and liner management. Progressing toward goals.  Pt will benefit from skilled therapeutic intervention in order to improve on the following deficits Abnormal gait;Decreased activity tolerance;Decreased balance;Decreased endurance;Decreased knowledge of precautions;Decreased knowledge of use of DME;Decreased mobility;Decreased range of motion;Decreased strength  Rehab Potential Good  PT Frequency 2x / week  PT Duration 8 weeks  PT Treatment/Interventions ADLs/Self Care Home Management;DME Instruction;Gait training;Stair training;Functional mobility training;Therapeutic activities;Therapeutic exercise;Balance training;Neuromuscular re-education;Patient/family education;Other (comment) (prosthetic training)  PT Next Visit Plan continue with single point cane towards updated STGs & LTGs.  Consulted and Agree with Plan of Care Patient;Family member/caregiver  Family Member Consulted fiance     Problem List There are no active problems to display for this patient.   Willow Ora 01/10/2015, 1:47 PM  Willow Ora, PTA, [facility] [address], [place], [place]  [phone] 01/10/2015, 1:47 PM

## 2015-01-12 ENCOUNTER — Ambulatory Visit: Payer: Medicare HMO | Admitting: Physical Therapy

## 2015-01-12 ENCOUNTER — Encounter: Payer: Self-pay | Admitting: Physical Therapy

## 2015-01-12 DIAGNOSIS — R269 Unspecified abnormalities of gait and mobility: Secondary | ICD-10-CM

## 2015-01-12 DIAGNOSIS — R531 Weakness: Secondary | ICD-10-CM

## 2015-01-12 DIAGNOSIS — Z89612 Acquired absence of left leg above knee: Secondary | ICD-10-CM

## 2015-01-12 DIAGNOSIS — R2689 Other abnormalities of gait and mobility: Secondary | ICD-10-CM

## 2015-01-12 DIAGNOSIS — R6889 Other general symptoms and signs: Secondary | ICD-10-CM

## 2015-01-12 NOTE — Therapy (Signed)
[facility] [address] [place], [place], [address] Phone: [phone]   Fax:  [phone]  Physical Therapy Treatment  Patient Details  Name: Natalie [name] MRN: [id number] Date of Birth: 06/03/1956 Referring Provider:  Dew, Jason S, MD  Encounter Date: 01/12/2015      PT End of Session - 01/12/15 1100    Visit Number 113   Number of Visits 20   Date for PT Re-Evaluation 03/05/15   PT Start Time 1100   PT Stop Time 1150   PT Time Calculation (min) 50 min   Equipment Utilized During Treatment Gait belt   Activity Tolerance Patient tolerated treatment well   Behavior During Therapy WFL for tasks assessed/performed      History reviewed. No pertinent past medical history.  History reviewed. No pertinent past surgical history.  There were no vitals filed for this visit.  Visit Diagnosis:  Abnormality of gait  Decreased functional activity tolerance  Balance problems  Status post above knee amputation of left lower extremity  Generalized weakness      Subjective Assessment - 01/12/15 1026    Subjective Has been using a quad cane some in house                          [facility] Adult PT Treatment/Exercise - 01/12/15 1015    Transfers   Sit to Stand 5: Supervision;With upper extremity assist;With armrests;From chair/3-in-1  to cane for stabilization   Stand to Sit 5: Supervision;With upper extremity assist;With armrests;To chair/3-in-1  cane for stablization   Ambulation/Gait   Ambulation/Gait Yes   Ambulation/Gait Assistance 4: Min assist   Ambulation Distance (Feet) 200 Feet  X 3   Assistive device Prosthesis;Straight cane   Gait Pattern Step-through pattern;Decreased step length - right;Decreased stance time - left;Decreased weight shift to left;Trunk flexed;Narrow base of support   Ambulation Surface Indoor;Level   Stairs Yes   Stairs Assistance 5: Supervision   Stairs Assistance  Details (indicate cue type and reason) cues on wt shift   Stair Management Technique One rail Left;With cane;Step to pattern;Forwards   Number of Stairs 4  2 reps   Ramp 3: Mod assist  cane & prosthesis   Ramp Details (indicate cue type and reason) manual, verbal & tactile cues on posture, wt shift   Curb 3: Mod assist  cane & prosthesis   Curb Details (indicate cue type and reason) cues on sequence & wt shift   Dynamic Standing Balance   Dynamic Standing - Balance Support --   Dynamic Standing - Level of Assistance --   Dynamic Standing - Balance Activities --   High Level Balance   High Level Balance Activities Side stepping;Backward walking;Turns;Figure 8 turns;Negotitating around obstacles;Negotiating over obstacles  cane & prosthesis with modA   High Level Balance Comments PT demo technique   Prosthetics   Prosthetic Care Comments  PT called to schedule lamination of socket & covering  appt 01/19/15 after PT appt   Current prosthetic wear tolerance (days/week)  daily   Current prosthetic wear tolerance (#hours/day)  all awake hours   Residual limb condition  --                  PT Short Term Goals - 01/05/15 1015    PT SHORT TERM GOAL #1   Title donnes prosthesis correclty modified independent. (Target Date: 12/11/2014)   Baseline Met 12/07/14   Time 4   Period   Weeks   Status Achieved   PT SHORT TERM GOAL #2   Title tolerates wear of prosthesis >8hrs total per day without skin issues or discomfort. (Target Date: 12/11/2014)   Baseline Met 12/07/14   Time 4   Period Weeks   Status Achieved   PT SHORT TERM GOAL #3   Title standing balance with rolling walker & prosthesis reaching 10" all directions, to floor, and manages clothes including toileting modified independent. (Target Date: 12/11/2014)   Time 4   Period Weeks   Status Achieved   PT SHORT TERM GOAL #4   Title ambulates 150' with rolling walker & prosthesis with supervision. (Target Date: 12/11/2014)   Baseline  Met 12/07/14   Time 4   Period Weeks   Status Achieved   PT SHORT TERM GOAL #5   Title negotiates ramp, curb with RW and stairs wtih 2 rails with prosthesis with supervision. (Target Date: 12/11/2014)   Time 4   Period Weeks   Status Achieved   Additional Short Term Goals   Additional Short Term Goals Yes   PT SHORT TERM GOAL #6   Title Reaches 10" without UE assist safely. (Target Date: 02/03/2025)   Time 4   Period Weeks   Status New   PT SHORT TERM GOAL #7   Title ambulates 300' with single point cane & prosthesis with contact assist. (Target Date: 02/03/2025)   Time 4   Period Weeks   Status New   PT SHORT TERM GOAL #8   Title negotiates ramp & curb with single point cane & prosthesis with minA. (Target Date: 02/03/2025)   Time 4   Period Weeks   Status New           PT Long Term Goals - 01/05/15 1015    PT LONG TERM GOAL #1   Title independent with prosthetic care. (Target Date: 01/08/2015)   Baseline MET 01/05/15   Time 8   Period Weeks   Status Achieved   PT LONG TERM GOAL #2   Title toelrates wear of prosthesis >90% of awake hours without issues. (NEW Target Date: 03/05/2015)   Baseline Partially MET 01/05/15 patient tolerates wear >90% of awake hours but has wound still present that is slowly healing.   Time 8   Period Weeks   Status On-going   PT LONG TERM GOAL #3   Title Berg Balance >24/56 with prosthesis. (Target Date: 01/08/2015)   Baseline MET 01/05/15   Time 8   Period Weeks   Status Achieved   PT LONG TERM GOAL #4   Title ambulates 300' with single point cane & prosthesis modified independent. (NEW Target Date: 03/05/2015)   Baseline MET with crutches on 01/05/15; goal updated to single point cane   Time 8   Period Weeks   Status Revised   PT LONG TERM GOAL #5   Title negotiates ramp, curb and stairs with single point cane & prosthesis modified indepenent. (NEW Target Date: 03/05/2015)   Baseline Partially MET 01/05/15 patient is modified independent with RW;  SBA with crutches & minA with cane.  Updated to single point cane.   Time 8   Period Weeks   Status Revised               Plan - 01/12/15 1100    Clinical Impression Statement Patient improved gait with cane to ambulate without support on contralateral UE with PT assistance. Patient improved ability to walk around and over 2" X 4" obstacles   with instruction & repetition.   Pt will benefit from skilled therapeutic intervention in order to improve on the following deficits Abnormal gait;Decreased activity tolerance;Decreased balance;Decreased endurance;Decreased knowledge of precautions;Decreased knowledge of use of DME;Decreased mobility;Decreased range of motion;Decreased strength   Rehab Potential Good   PT Frequency 2x / week   PT Duration 8 weeks   PT Treatment/Interventions ADLs/Self Care Home Management;DME Instruction;Gait training;Stair training;Functional mobility training;Therapeutic activities;Therapeutic exercise;Balance training;Neuromuscular re-education;Patient/family education;Other (comment)  prosthetic training   PT Next Visit Plan continue with single point cane towards updated STGs & LTGs.   Consulted and Agree with Plan of Care Patient        Problem List There are no active problems to display for this patient.   Jamey Reas PT ,DPT 01/12/2015, 1:42 PM  [facility] [address] [place], [place], [address] Phone: [phone]   Fax:  [phone]

## 2015-01-14 ENCOUNTER — Encounter: Payer: Self-pay | Admitting: Physical Therapy

## 2015-01-14 ENCOUNTER — Ambulatory Visit: Payer: Medicare HMO | Admitting: Physical Therapy

## 2015-01-14 DIAGNOSIS — R6889 Other general symptoms and signs: Secondary | ICD-10-CM

## 2015-01-14 DIAGNOSIS — R531 Weakness: Secondary | ICD-10-CM

## 2015-01-14 DIAGNOSIS — R269 Unspecified abnormalities of gait and mobility: Secondary | ICD-10-CM | POA: Diagnosis not present

## 2015-01-14 DIAGNOSIS — R2689 Other abnormalities of gait and mobility: Secondary | ICD-10-CM

## 2015-01-14 NOTE — Therapy (Signed)
[facility] [address] [place], [place], [address] Phone: [phone]   Fax:  [phone]  Physical Therapy Treatment  Patient Details  Name: Natalie [name] MRN: [id number] Date of Birth: 1956/05/28 Referring Provider:  Algernon Huxley, MD  Encounter Date: 01/14/2015      PT End of Session - 01/14/15 1022    Visit Number 14   Number of Visits 20   Date for PT Re-Evaluation 03/05/15   PT Start Time 1018   PT Stop Time 1100   PT Time Calculation (min) 42 min   Equipment Utilized During Treatment Gait belt   Activity Tolerance Patient tolerated treatment well   Behavior During Therapy [facility] for tasks assessed/performed      History reviewed. No pertinent past medical history.  History reviewed. No pertinent past surgical history.  There were no vitals filed for this visit.  Visit Diagnosis:  Abnormality of gait  Decreased functional activity tolerance  Balance problems  Generalized weakness      Subjective Assessment - 01/14/15 1022    Subjective No new complaints. No pain or falls to report.   Currently in Pain? No/denies          [facility] Adult PT Treatment/Exercise - 01/14/15 1023    Transfers   Sit to Stand 5: Supervision;With upper extremity assist;With armrests;From chair/3-in-1   Stand to Sit 5: Supervision;With upper extremity assist;With armrests;To chair/3-in-1   Ambulation/Gait   Ambulation/Gait Yes   Ambulation/Gait Assistance 5: Supervision   Ambulation/Gait Assistance Details to ensure prosthesis was redonned secure and correctly before performing balance activities   Ambulation Distance (Feet) 120 Feet   Assistive device Crutches;Prosthesis   Gait Pattern Step-through pattern;Decreased step length - right;Decreased stance time - left;Decreased weight shift to left;Trunk flexed;Narrow base of support   Ambulation Surface Level;Indoor   Dynamic Standing Balance   Dynamic Standing - Balance  Support No upper extremity supported;During functional activity   Dynamic Standing - Level of Assistance 4: Min assist   Dynamic Standing - Balance Activities Compliant surfaces;Eyes closed;Head turns;Head nods   Dynamic Standing - Comments standing on air ex: eyes closed no head movements, eyes closed head nods/shakes, eyes open alternating UE raises, bil UE raises. cues on posture and equal weight for stability   High Level Balance   High Level Balance Activities Side stepping;Backward walking   High Level Balance Comments in parallel bars with single UE support, 4-5 laps each. cues on posture and technique/form.   Knee/Hip Exercises: Aerobic   Stationary Bike Scifit x 4 extremities level 3.5 x 8 minutes with goal rpm >/= 55 for strengthening and activity tolerance        Prosthetics   Prosthetic Care Comments  Pt with new muscle knot in her right wrist that is painful with weight bearing on devices, especially with a cane just on that side. Had Maudry Diego, OT look at pt's wrist for second opinion. was agreed that it's muscular and should be checked out by her MD as it is a new issues. Pt to make appointment with her primary MD to have wrist looked at. Until then advised pt to use bil UE support with gait to decrease pressure on right wrist.   Current prosthetic wear tolerance (days/week)  daily   Current prosthetic wear tolerance (#hours/day)  all awake hours   Residual limb condition  visualized. pt's wound now smaller. "pin prick" size hole, ~0.4 cm depth with no tunneling or undermining palpated on exam. packed  with small 2x2 strip and covered with Tegaderm. clean margins and no odor present.                             Education Provided Correct ply sock adjustment;Residual limb care  to pull socks tight and    Person(s) Educated Patient   Education Method Explanation   Education Method Verbalized understanding   Donning Prosthesis Modified independent (device/increased time)   Doffing  Prosthesis Modified independent (device/increased time)           PT Short Term Goals - 01/05/15 1015    PT SHORT TERM GOAL #1   Title donnes prosthesis correclty modified independent. (Target Date: 12/11/2014)   Baseline Met 12/07/14   Time 4   Period Weeks   Status Achieved   PT SHORT TERM GOAL #2   Title tolerates wear of prosthesis >8hrs total per day without skin issues or discomfort. (Target Date: 12/11/2014)   Baseline Met 12/07/14   Time 4   Period Weeks   Status Achieved   PT SHORT TERM GOAL #3   Title standing balance with rolling walker & prosthesis reaching 10" all directions, to floor, and manages clothes including toileting modified independent. (Target Date: 12/11/2014)   Time 4   Period Weeks   Status Achieved   PT SHORT TERM GOAL #4   Title ambulates 150' with rolling walker & prosthesis with supervision. (Target Date: 12/11/2014)   Baseline Met 12/07/14   Time 4   Period Weeks   Status Achieved   PT SHORT TERM GOAL #5   Title negotiates ramp, curb with RW and stairs wtih 2 rails with prosthesis with supervision. (Target Date: 12/11/2014)   Time 4   Period Weeks   Status Achieved   Additional Short Term Goals   Additional Short Term Goals Yes   PT SHORT TERM GOAL #6   Title Reaches 10" without UE assist safely. (Target Date: 02/03/2025)   Time 4   Period Weeks   Status New   PT SHORT TERM GOAL #7   Title ambulates 300' with single point cane & prosthesis with contact assist. (Target Date: 02/03/2025)   Time 4   Period Weeks   Status New   PT SHORT TERM GOAL #8   Title negotiates ramp & curb with single point cane & prosthesis with minA. (Target Date: 02/03/2025)   Time 4   Period Weeks   Status New           PT Long Term Goals - 01/05/15 1015    PT LONG TERM GOAL #1   Title independent with prosthetic care. (Target Date: 01/08/2015)   Baseline MET 01/05/15   Time 8   Period Weeks   Status Achieved   PT LONG TERM GOAL #2   Title toelrates wear of  prosthesis >90% of awake hours without issues. (NEW Target Date: 03/05/2015)   Baseline Partially MET 01/05/15 patient tolerates wear >90% of awake hours but has wound still present that is slowly healing.   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 (Target Date: 01/08/2015)   Baseline MET 01/05/15   Time 8   Period Weeks   Status Achieved   PT LONG TERM GOAL #4   Title ambulates 300' with single point cane & prosthesis modified independent. (NEW Target Date: 03/05/2015)   Baseline MET  with crutches on 01/05/15; goal updated to single point cane   Time 8   Period Weeks   Status Revised   PT LONG TERM GOAL #5   Title negotiates ramp, curb and stairs with single point cane & prosthesis modified indepenent. (NEW Target Date: 03/05/2015)   Baseline Partially MET 01/05/15 patient is modified independent with RW; SBA with crutches & minA with cane.  Updated to single point cane.   Time 8   Period Weeks   Status Revised            Plan - 01/14/15 1023    Clinical Impression Statement Pt now with area on right wrist that is painful with weight bearing. Pt to have this looked at by her MD. Arlyss Queen on balance and strengthening today with limited UE weight bearing due to pain. Progressing toward goals.   Pt will benefit from skilled therapeutic intervention in order to improve on the following deficits Abnormal gait;Decreased activity tolerance;Decreased balance;Decreased endurance;Decreased knowledge of precautions;Decreased knowledge of use of DME;Decreased mobility;Decreased range of motion;Decreased strength   Rehab Potential Good   PT Frequency 2x / week   PT Duration 8 weeks   PT Treatment/Interventions ADLs/Self Care Home Management;DME Instruction;Gait training;Stair training;Functional mobility training;Therapeutic activities;Therapeutic exercise;Balance training;Neuromuscular re-education;Patient/family education;Other  (comment)  prosthetic training   PT Next Visit Plan continue with single point cane towards updated STGs & LTGs.   Consulted and Agree with Plan of Care Patient        Problem List There are no active problems to display for this patient.   Willow Ora 01/15/2015, 11:42 AM  Willow Ora, PTA, [facility] [address] [place], [place] [address] [phone] 01/15/2015, 11:42 AM

## 2015-01-19 ENCOUNTER — Ambulatory Visit: Payer: Medicare HMO | Admitting: Physical Therapy

## 2015-01-19 ENCOUNTER — Encounter: Payer: Self-pay | Admitting: Physical Therapy

## 2015-01-19 ENCOUNTER — Encounter: Payer: Medicare HMO | Admitting: Physical Therapy

## 2015-01-19 DIAGNOSIS — R269 Unspecified abnormalities of gait and mobility: Secondary | ICD-10-CM

## 2015-01-19 DIAGNOSIS — R531 Weakness: Secondary | ICD-10-CM

## 2015-01-19 DIAGNOSIS — Z89612 Acquired absence of left leg above knee: Secondary | ICD-10-CM

## 2015-01-19 DIAGNOSIS — R6889 Other general symptoms and signs: Secondary | ICD-10-CM

## 2015-01-19 DIAGNOSIS — R2689 Other abnormalities of gait and mobility: Secondary | ICD-10-CM

## 2015-01-19 NOTE — Therapy (Signed)
[facility] [address] [place], [place], [address] Phone: [phone]   Fax:  [phone]  Physical Therapy Treatment  Patient Details  Name: Taleigh Gero MRN: [id number] Date of Birth: [birth year] Referring Provider:  Algernon Huxley, MD  Encounter Date: 01/19/2015      PT End of Session - 01/19/15 1015    Visit Number 15   Number of Visits 20   Date for PT Re-Evaluation 03/05/15   PT Start Time 3016   PT Stop Time 1100   PT Time Calculation (min) 45 min   Equipment Utilized During Treatment Gait belt   Activity Tolerance Patient tolerated treatment well   Behavior During Therapy [facility] for tasks assessed/performed      History reviewed. No pertinent past medical history.  History reviewed. No pertinent past surgical history.  There were no vitals filed for this visit.  Visit Diagnosis:  Abnormality of gait  Decreased functional activity tolerance  Balance problems  Generalized weakness  Status post above knee amputation of left lower extremity      Subjective Assessment - 01/19/15 1630    Subjective --   Currently in Pain? --                         [facility] Adult PT Treatment/Exercise - 01/19/15 1015    Transfers   Sit to Stand 5: Supervision;With upper extremity assist;With armrests;From chair/3-in-1  from chair without armrests using UEs, from simulated couch   Sit to Stand Details (indicate cue type and reason) cues on technique without armrests & soft surfaces   Stand to Sit 5: Supervision;With upper extremity assist;With armrests;To chair/3-in-1  to chairs without armrests with UEs & to simulated couch   Stand to Sit Details cues on technique without armrests.   Ambulation/Gait   Ambulation/Gait Yes   Ambulation/Gait Assistance 4: Min guard;5: Supervision   Ambulation/Gait Assistance Details cues on balance, posture, wt shift   Ambulation Distance (Feet) 500 Feet  2 reps    Assistive device Prosthesis;Small based quad cane   Gait Pattern Step-through pattern;Decreased step length - right;Decreased stance time - left;Decreased weight shift to left;Trunk flexed;Narrow base of support   Ambulation Surface Indoor;Level   Stairs Assistance 5: Supervision   Stairs Assistance Details (indicate cue type and reason) cues on wt shift in prosthetic stance   Stair Management Technique One rail Right;With cane;Step to pattern;Forwards   Number of Stairs 4   Ramp 4: Min assist  SBQC & prosthesis   Ramp Details (indicate cue type and reason) cues on posture & wt shift   Curb 4: Min assist   Curb Details (indicate cue type and reason) cues on sequence & wt shift   Dynamic Standing Balance   Dynamic Standing - Balance Support Right upper extremity supported  SBQC   Dynamic Standing - Level of Assistance 4: Min assist   Dynamic Standing - Balance Activities Head turns;Head nods  during gait   Dynamic Standing - Comments gait with head turns   High Level Balance   High Level Balance Activities Side stepping;Backward walking   High Level Balance Comments with SBQC & prosthesis with cues on wt shift in stance   Knee/Hip Exercises: Aerobic   Stationary Bike --   Prosthetics   Prosthetic Care Comments  Having prosthetic socket laminated & prosthesis covered  Right wrist soft tissue growth appears to be cyst with plans   Current prosthetic wear tolerance (days/week)  daily   Current prosthetic wear tolerance (#hours/day)  all awake hours   Residual limb condition  --     Patient carried a plate of objects while ambulating with SBQC 42' with CGA & cues              PT Short Term Goals - 01/05/15 1015    PT SHORT TERM GOAL #1   Title donnes prosthesis correclty modified independent. (Target Date: 12/11/2014)   Baseline Met 12/07/14   Time 4   Period Weeks   Status Achieved   PT SHORT TERM GOAL #2   Title tolerates wear of prosthesis >8hrs total per day without  skin issues or discomfort. (Target Date: 12/11/2014)   Baseline Met 12/07/14   Time 4   Period Weeks   Status Achieved   PT SHORT TERM GOAL #3   Title standing balance with rolling walker & prosthesis reaching 10" all directions, to floor, and manages clothes including toileting modified independent. (Target Date: 12/11/2014)   Time 4   Period Weeks   Status Achieved   PT SHORT TERM GOAL #4   Title ambulates 150' with rolling walker & prosthesis with supervision. (Target Date: 12/11/2014)   Baseline Met 12/07/14   Time 4   Period Weeks   Status Achieved   PT SHORT TERM GOAL #5   Title negotiates ramp, curb with RW and stairs wtih 2 rails with prosthesis with supervision. (Target Date: 12/11/2014)   Time 4   Period Weeks   Status Achieved   Additional Short Term Goals   Additional Short Term Goals Yes   PT SHORT TERM GOAL #6   Title Reaches 10" without UE assist safely. (Target Date: 02/03/2025)   Time 4   Period Weeks   Status New   PT SHORT TERM GOAL #7   Title ambulates 300' with single point cane & prosthesis with contact assist. (Target Date: 02/03/2025)   Time 4   Period Weeks   Status New   PT SHORT TERM GOAL #8   Title negotiates ramp & curb with single point cane & prosthesis with minA. (Target Date: 02/03/2025)   Time 4   Period Weeks   Status New           PT Long Term Goals - 01/05/15 1015    PT LONG TERM GOAL #1   Title independent with prosthetic care. (Target Date: 01/08/2015)   Baseline MET 01/05/15   Time 8   Period Weeks   Status Achieved   PT LONG TERM GOAL #2   Title toelrates wear of prosthesis >90% of awake hours without issues. (NEW Target Date: 03/05/2015)   Baseline Partially MET 01/05/15 patient tolerates wear >90% of awake hours but has wound still present that is slowly healing.   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 (Target Date: 01/08/2015)   Baseline MET 01/05/15   Time 8    Period Weeks   Status Achieved   PT LONG TERM GOAL #4   Title ambulates 300' with single point cane & prosthesis modified independent. (NEW Target Date: 03/05/2015)   Baseline MET with crutches on 01/05/15; goal updated to single point cane   Time 8   Period Weeks   Status Revised   PT LONG TERM GOAL #5   Title negotiates ramp, curb and stairs with single point cane & prosthesis modified indepenent. (NEW Target Date:  03/05/2015)   Baseline Partially MET 01/05/15 patient is modified independent with RW; SBA with crutches & minA with cane.  Updated to single point cane.   Time 8   Period Weeks   Status Revised               Plan - 01/19/15 1015    Clinical Impression Statement Patient has growth on right wrist that appears to be a cyst with plans to consult with her PCP. Patient required less assistance with prosthetic gait with [facility] and even carried a plate while walking.   Pt will benefit from skilled therapeutic intervention in order to improve on the following deficits Abnormal gait;Decreased activity tolerance;Decreased balance;Decreased endurance;Decreased knowledge of precautions;Decreased knowledge of use of DME;Decreased mobility;Decreased range of motion;Decreased strength   Rehab Potential Good   PT Frequency 2x / week   PT Duration 8 weeks   PT Treatment/Interventions ADLs/Self Care Home Management;DME Instruction;Gait training;Stair training;Functional mobility training;Therapeutic activities;Therapeutic exercise;Balance training;Neuromuscular re-education;Patient/family education;Other (comment)  prosthetic training   PT Next Visit Plan continue with [facility] towards updated STGs & LTGs.   Consulted and Agree with Plan of Care Patient        Problem List There are no active problems to display for this patient.   Jamey Reas PT, DPT 01/19/2015, 5:44 PM  [place] [address] [place], [place],  [address] Phone: [phone]   Fax:  [phone]

## 2015-01-21 ENCOUNTER — Encounter: Payer: Medicare HMO | Admitting: Physical Therapy

## 2015-01-26 ENCOUNTER — Encounter: Payer: Medicare HMO | Admitting: Physical Therapy

## 2015-01-26 ENCOUNTER — Ambulatory Visit: Payer: Medicare HMO | Admitting: Physical Therapy

## 2015-01-26 ENCOUNTER — Encounter: Payer: Self-pay | Admitting: Physical Therapy

## 2015-01-26 DIAGNOSIS — R269 Unspecified abnormalities of gait and mobility: Secondary | ICD-10-CM

## 2015-01-26 DIAGNOSIS — Z89612 Acquired absence of left leg above knee: Secondary | ICD-10-CM

## 2015-01-26 DIAGNOSIS — R2689 Other abnormalities of gait and mobility: Secondary | ICD-10-CM

## 2015-01-26 DIAGNOSIS — R531 Weakness: Secondary | ICD-10-CM

## 2015-01-26 DIAGNOSIS — R6889 Other general symptoms and signs: Secondary | ICD-10-CM

## 2015-01-26 NOTE — Therapy (Signed)
[facility] [address] [place], [place], [address] Phone: [phone]   Fax:  [phone]  Physical Therapy Treatment  Patient Details  Name: Natalie [name] MRN: [id number] Date of Birth: [birth year] Referring Provider:  Algernon Huxley, MD  Encounter Date: 01/26/2015      PT End of Session - 01/26/15 0930    Visit Number 16   Number of Visits 20   Date for PT Re-Evaluation 03/05/15   PT Start Time 0931   PT Stop Time 1014   PT Time Calculation (min) 43 min   Equipment Utilized During Treatment Gait belt   Activity Tolerance Patient tolerated treatment well   Behavior During Therapy [facility] for tasks assessed/performed      History reviewed. No pertinent past medical history.  History reviewed. No pertinent past surgical history.  There were no vitals filed for this visit.  Visit Diagnosis:  Abnormality of gait  Decreased functional activity tolerance  Balance problems  Generalized weakness  Status post above knee amputation of left lower extremity      Subjective Assessment - 01/26/15 0932    Subjective No falls. Pleased with cover that prosthetist put on prosthesis.   Currently in Pain? No/denies                         [facility] Adult PT Treatment/Exercise - 01/26/15 0930    Transfers   Sit to Stand 5: Supervision;With upper extremity assist;With armrests;From chair/3-in-1  from chair without armrests using UEs   Sit to Stand Details (indicate cue type and reason) cues on prosthetic knee control   Stand to Sit 5: Supervision;With upper extremity assist;With armrests;To chair/3-in-1  to chairs without armrests with UEs   Stand to Sit Details cues on proper, safe hand to reach   Ambulation/Gait   Ambulation/Gait Yes   Ambulation/Gait Assistance 4: Min guard;5: Supervision   Ambulation/Gait Assistance Details cues on posture, wt shift over prosthesis in stance   Ambulation Distance  (Feet) 500 Feet  2 reps   Assistive device Prosthesis;Small based quad cane   Gait Pattern Step-through pattern;Decreased step length - right;Decreased stance time - left;Decreased weight shift to left;Trunk flexed;Narrow base of support   Ambulation Surface Indoor;Level   Stairs Assistance 5: Supervision   Stairs Assistance Details (indicate cue type and reason) cues on wt shift & knee control   Stair Management Technique One rail Right;With cane;Step to pattern;Forwards;One rail Left   Number of Stairs 4  2 reps   Ramp 4: Min assist  SBQC & prosthesis   Ramp Details (indicate cue type and reason) cues on posture & wt shift   Curb 4: Min assist   Curb Details (indicate cue type and reason) cues on sequence & knee control   Dynamic Standing Balance   Dynamic Standing - Balance Support During functional activity   Dynamic Standing - Level of Assistance 4: Min assist;5: Stand by assistance   Dynamic Standing - Balance Activities Reaching for weighted objects  red theraband reciprocal & BUE row, upward reach, forward re   High Level Balance   High Level Balance Activities Side stepping;Backward walking   High Level Balance Comments with SBQC & prosthesis with cues on wt shift in stance   Prosthetics   Prosthetic Care Comments  at night for next week, store prosthesis with knee flexed to stretch new cover  Right wrist soft tissue growth appears to be cyst with plans   Current  prosthetic wear tolerance (days/week)  daily   Current prosthetic wear tolerance (#hours/day)  all awake hours                PT Education - 01/26/15 0930    Education provided Yes   Education Details bring shoes that she plans to wear to her wedding on 02/06/2015 to assess gait & knee control   Person(s) Educated Patient   Methods Explanation   Comprehension Verbalized understanding          PT Short Term Goals - 01/05/15 1015    PT SHORT TERM GOAL #1   Title donnes prosthesis correclty modified  independent. (Target Date: 12/11/2014)   Baseline Met 12/07/14   Time 4   Period Weeks   Status Achieved   PT SHORT TERM GOAL #2   Title tolerates wear of prosthesis >8hrs total per day without skin issues or discomfort. (Target Date: 12/11/2014)   Baseline Met 12/07/14   Time 4   Period Weeks   Status Achieved   PT SHORT TERM GOAL #3   Title standing balance with rolling walker & prosthesis reaching 10" all directions, to floor, and manages clothes including toileting modified independent. (Target Date: 12/11/2014)   Time 4   Period Weeks   Status Achieved   PT SHORT TERM GOAL #4   Title ambulates 150' with rolling walker & prosthesis with supervision. (Target Date: 12/11/2014)   Baseline Met 12/07/14   Time 4   Period Weeks   Status Achieved   PT SHORT TERM GOAL #5   Title negotiates ramp, curb with RW and stairs wtih 2 rails with prosthesis with supervision. (Target Date: 12/11/2014)   Time 4   Period Weeks   Status Achieved   Additional Short Term Goals   Additional Short Term Goals Yes   PT SHORT TERM GOAL #6   Title Reaches 10" without UE assist safely. (Target Date: 02/03/2025)   Time 4   Period Weeks   Status New   PT SHORT TERM GOAL #7   Title ambulates 300' with single point cane & prosthesis with contact assist. (Target Date: 02/03/2025)   Time 4   Period Weeks   Status New   PT SHORT TERM GOAL #8   Title negotiates ramp & curb with single point cane & prosthesis with minA. (Target Date: 02/03/2025)   Time 4   Period Weeks   Status New           PT Long Term Goals - 01/05/15 1015    PT LONG TERM GOAL #1   Title independent with prosthetic care. (Target Date: 01/08/2015)   Baseline MET 01/05/15   Time 8   Period Weeks   Status Achieved   PT LONG TERM GOAL #2   Title toelrates wear of prosthesis >90% of awake hours without issues. (NEW Target Date: 03/05/2015)   Baseline Partially MET 01/05/15 patient tolerates wear >90% of awake hours but has wound still present that is  slowly healing.   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 (Target Date: 01/08/2015)   Baseline MET 01/05/15   Time 8   Period Weeks   Status Achieved   PT LONG TERM GOAL #4   Title ambulates 300' with single point cane & prosthesis modified independent. (NEW Target Date: 03/05/2015)   Baseline MET with crutches on 01/05/15; goal updated to single point cane  Time 8   Period Weeks   Status Revised   PT LONG TERM GOAL #5   Title negotiates ramp, curb and stairs with single point cane & prosthesis modified indepenent. (NEW Target Date: 03/05/2015)   Baseline Partially MET 01/05/15 patient is modified independent with RW; SBA with crutches & minA with cane.  Updated to single point cane.   Time 8   Period Weeks   Status Revised               Plan - 01/26/15 0930    Clinical Impression Statement Patient appears safe to use SBQC in home situation and at basic community with family supervision. Patient requires less assistance to perform standing balance stationery without UE support.   Pt will benefit from skilled therapeutic intervention in order to improve on the following deficits Decreased activity tolerance;Decreased balance;Decreased endurance;Decreased knowledge of precautions;Decreased knowledge of use of DME;Decreased mobility;Decreased range of motion;Decreased strength;Abnormal gait   Rehab Potential Good   PT Frequency 2x / week   PT Duration 8 weeks   PT Treatment/Interventions ADLs/Self Care Home Management;DME Instruction;Gait training;Stair training;Functional mobility training;Therapeutic activities;Therapeutic exercise;Balance training;Neuromuscular re-education;Patient/family education;Other (comment)  prosthetic training   PT Next Visit Plan continue with SBQC, assess STGs next week   Consulted and Agree with Plan of Care Patient        Problem List There are no active problems to display for  this patient.   Jamey Reas PT, DPT 01/26/2015, 9:32 PM  [facility] [address] [place], [place], [address] Phone: [phone]   Fax:  [phone]

## 2015-01-28 ENCOUNTER — Encounter: Payer: Medicare HMO | Admitting: Physical Therapy

## 2015-02-02 ENCOUNTER — Encounter: Payer: Self-pay | Admitting: Physical Therapy

## 2015-02-02 ENCOUNTER — Ambulatory Visit: Payer: Medicare HMO | Admitting: Physical Therapy

## 2015-02-02 DIAGNOSIS — R269 Unspecified abnormalities of gait and mobility: Secondary | ICD-10-CM

## 2015-02-02 DIAGNOSIS — R6889 Other general symptoms and signs: Secondary | ICD-10-CM

## 2015-02-03 NOTE — Therapy (Signed)
[facility] [address] [place], [place], [address] Phone: [phone]   Fax:  [phone]  Physical Therapy Treatment  Patient Details  Name: Natalie [name] MRN: [id number] Date of Birth: [birth year] Referring Provider:  Algernon Huxley, MD  Encounter Date: 02/02/2015      PT End of Session - 02/02/15 0939    Visit Number 17   Number of Visits 20   Date for PT Re-Evaluation 03/05/15   PT Start Time 0932   PT Stop Time 1015   PT Time Calculation (min) 43 min   Equipment Utilized During Treatment Gait belt   Activity Tolerance Patient tolerated treatment well   Behavior During Therapy [facility] for tasks assessed/performed      History reviewed. No pertinent past medical history.  History reviewed. No pertinent past surgical history.  There were no vitals filed for this visit.  Visit Diagnosis:  Abnormality of gait  Decreased functional activity tolerance      Subjective Assessment - 02/02/15 0937    Subjective No falls or pain to report. Using [facility] in home only at this time, not ready to use out of home due to fear of falling before her wedding this weekend. Plans to try it out in community (small distances) after her wedding.        02/02/15 0945  Transfers  Sit to Stand 5: Supervision;With upper extremity assist;With armrests;From chair/3-in-1  Sit to Stand Details (indicate cue type and reason) cues on prosthetic knee control, foot placement  Stand to Sit 5: Supervision;With upper extremity assist;With armrests;To chair/3-in-1  Stand to Sit Details cues to turn all the way to the surface before sitting down  Ambulation/Gait  Ambulation/Gait Yes  Ambulation/Gait Assistance 4: Min guard  Ambulation/Gait Assistance Details cues on posture and step length with use of cane. used walker intitally after changing shoes for safety until it was determined how her prosthetic knee was going to operate with lower  shoe's. Then changed back to the cane with no issues noted.                          Ambulation Distance (Feet) 230 Feet (40 feet x2 with wedding shoes)  Assistive device Prosthesis;Small based quad cane;Rolling walker  Gait Pattern Step-through pattern;Decreased step length - right;Decreased stance time - left;Decreased weight shift to left;Trunk flexed;Narrow base of support  Ambulation Surface Level;Indoor  Prosthetics  Prosthetic Care Comments  Put pt's wedding shoes on today which have significantly lower heel height (1cm vs 3cm)                  Current prosthetic wear tolerance (days/week)  daily  Current prosthetic wear tolerance (#hours/day)  all awake hours  Residual limb condition  intact per pt.  Education Provided Proper wear schedule/adjustment;Residual limb care  Person(s) Educated Patient  Education Method Explanation;Verbal cues  Education Method Verbalized understanding  Donning Prosthesis 6  Doffing Prosthesis 6          PT Short Term Goals - 02/02/15 0941    PT SHORT TERM GOAL #1   Title donnes prosthesis correclty modified independent. (Target Date: 12/11/2014)   Baseline Met 12/07/14   Status Achieved   PT SHORT TERM GOAL #2   Title tolerates wear of prosthesis >8hrs total per day without skin issues or discomfort. (Target Date: 12/11/2014)   Baseline Met 12/07/14   Status Achieved   PT SHORT TERM GOAL #3  Title standing balance with rolling walker & prosthesis reaching 10" all directions, to floor, and manages clothes including toileting modified independent. (Target Date: 12/11/2014)   Status Achieved   PT SHORT TERM GOAL #4   Title ambulates 150' with rolling walker & prosthesis with supervision. (Target Date: 12/11/2014)   Baseline Met 12/07/14   Status Achieved   PT SHORT TERM GOAL #5   Title negotiates ramp, curb with RW and stairs wtih 2 rails with prosthesis with supervision. (Target Date: 12/11/2014)   Status Achieved   PT SHORT TERM GOAL #6   Title Reaches  10" without UE assist safely. (Target Date: 02/03/2025)   Baseline 02/02/15: able to reach 10 inches out of base of support without UE support   Time 4   Period Weeks   Status Achieved   PT SHORT TERM GOAL #7   Title ambulates 300' with single point cane & prosthesis with contact assist. (Target Date: 02/03/2025)   Status New   PT SHORT TERM GOAL #8   Title negotiates ramp & curb with single point cane & prosthesis with minA. (Target Date: 02/03/2025)   Status New           PT Long Term Goals - 01/05/15 1015    PT LONG TERM GOAL #1   Title independent with prosthetic care. (Target Date: 01/08/2015)   Baseline MET 01/05/15   Time 8   Period Weeks   Status Achieved   PT LONG TERM GOAL #2   Title toelrates wear of prosthesis >90% of awake hours without issues. (NEW Target Date: 03/05/2015)   Baseline Partially MET 01/05/15 patient tolerates wear >90% of awake hours but has wound still present that is slowly healing.   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 (Target Date: 01/08/2015)   Baseline MET 01/05/15   Time 8   Period Weeks   Status Achieved   PT LONG TERM GOAL #4   Title ambulates 300' with single point cane & prosthesis modified independent. (NEW Target Date: 03/05/2015)   Baseline MET with crutches on 01/05/15; goal updated to single point cane   Time 8   Period Weeks   Status Revised   PT LONG TERM GOAL #5   Title negotiates ramp, curb and stairs with single point cane & prosthesis modified indepenent. (NEW Target Date: 03/05/2015)   Baseline Partially MET 01/05/15 patient is modified independent with RW; SBA with crutches & minA with cane.  Updated to single point cane.   Time 8   Period Weeks   Status Revised           Plan - 02/02/15 0939    Clinical Impression Statement Trialed gait with pt's wedding shoes today with no knee control issues noted. STG number 6 met today. Will assess remaining STG's  next visit.   Pt will benefit from skilled therapeutic intervention in order to improve on the following deficits Decreased activity tolerance;Decreased balance;Decreased endurance;Decreased knowledge of precautions;Decreased knowledge of use of DME;Decreased mobility;Decreased range of motion;Decreased strength;Abnormal gait   Rehab Potential Good   PT Frequency 2x / week   PT Duration 8 weeks   PT Treatment/Interventions ADLs/Self Care Home Management;DME Instruction;Gait training;Stair training;Functional mobility training;Therapeutic activities;Therapeutic exercise;Balance training;Neuromuscular re-education;Patient/family education;Other (comment)  prosthetic training   PT Next Visit Plan continue with [facility], assess remaining STG's next visit   Consulted and Agree with Plan of Care Patient  Problem List There are no active problems to display for this patient.   Willow Ora 02/03/2015, 2:44 PM  Willow Ora, PTA, [place] [address], [place], [place] [address] [phone] 02/03/2015, 2:44 PM

## 2015-02-10 ENCOUNTER — Encounter: Payer: Self-pay | Admitting: Physical Therapy

## 2015-02-10 ENCOUNTER — Encounter: Payer: Medicare HMO | Admitting: Physical Therapy

## 2015-02-10 ENCOUNTER — Ambulatory Visit: Payer: Medicare HMO | Attending: Vascular Surgery | Admitting: Physical Therapy

## 2015-02-10 DIAGNOSIS — Z89612 Acquired absence of left leg above knee: Secondary | ICD-10-CM | POA: Insufficient documentation

## 2015-02-10 DIAGNOSIS — R531 Weakness: Secondary | ICD-10-CM

## 2015-02-10 DIAGNOSIS — R6889 Other general symptoms and signs: Secondary | ICD-10-CM | POA: Diagnosis present

## 2015-02-10 DIAGNOSIS — R2689 Other abnormalities of gait and mobility: Secondary | ICD-10-CM

## 2015-02-10 DIAGNOSIS — R29818 Other symptoms and signs involving the nervous system: Secondary | ICD-10-CM | POA: Diagnosis present

## 2015-02-10 DIAGNOSIS — R269 Unspecified abnormalities of gait and mobility: Secondary | ICD-10-CM | POA: Diagnosis present

## 2015-02-10 NOTE — Therapy (Signed)
[facility] [address] [place], [place], [address] Phone: [phone]   Fax:  [phone]  Physical Therapy Treatment  Patient Details  Name: Natalie [name] MRN: [id number] Date of Birth: 1956-04-03 Referring Provider:  Algernon Huxley, MD  Encounter Date: 02/10/2015      PT End of Session - 02/10/15 0930    Visit Number 18   Number of Visits 21   Date for PT Re-Evaluation 03/05/15   PT Start Time 0932   PT Stop Time 1015   PT Time Calculation (min) 43 min   Equipment Utilized During Treatment Gait belt   Activity Tolerance Patient tolerated treatment well   Behavior During Therapy [facility] for tasks assessed/performed      History reviewed. No pertinent past medical history.  History reviewed. No pertinent past surgical history.  There were no vitals filed for this visit.  Visit Diagnosis:  Abnormality of gait  Decreased functional activity tolerance  Balance problems  Generalized weakness  Status post above knee amputation of left lower extremity      Subjective Assessment - 02/10/15 0942    Subjective No falls or issues. Got married 4 days ago and used walker to walk down then aisle.   Currently in Pain? No/denies                         [facility] Adult PT Treatment/Exercise - 02/10/15 0930    Transfers   Sit to Stand 5: Supervision;With upper extremity assist;With armrests;From chair/3-in-1   Stand to Sit 5: Supervision;With upper extremity assist;With armrests;To chair/3-in-1   Ambulation/Gait   Ambulation/Gait Yes   Ambulation/Gait Assistance 4: Min guard;5: Supervision   Ambulation/Gait Assistance Details Carrying plate with objects 50', then glass of water 50', verbal cues on posture & wt shift over prosthesis in stance   Ambulation Distance (Feet) 350 Feet  350 X 2   Assistive device Small based quad cane;Prosthesis   Gait Pattern Step-through pattern;Decreased step length -  right;Decreased stance time - left;Decreased weight shift to left;Trunk flexed;Narrow base of support   Ambulation Surface Indoor;Level   Stairs Assistance 5: Supervision   Stairs Assistance Details (indicate cue type and reason) cues on wt shift   Stair Management Technique One rail Right;One rail Left;Forwards;With cane   Number of Stairs 4  3 reps   Ramp 5: Supervision  SBQC & prosthesis   Ramp Details (indicate cue type and reason) cues on step length, posture & wt shift   Curb 4: Min assist;5: Supervision  SBQC & prosthesis   Curb Details (indicate cue type and reason) cues on sequence & knee control   Dynamic Standing Balance   Dynamic Standing - Balance Support During functional activity  SBQC   Dynamic Standing - Level of Assistance 4: Min assist;5: Stand by assistance   Dynamic Standing - Balance Activities Turn head to look over shoulder;Reaching for objects   Reaching for objects comments: looking to floor   Turn head to look over shoulder comments: tactile cues on wt shift   High Level Balance   High Level Balance Activities Side stepping;Backward walking;Turns;Figure 8 turns;Negotitating around obstacles;Negotiating over obstacles   High Level Balance Comments with [facility] & prosthesis with cues on wt shift in stance   Prosthetics   Prosthetic Care Comments  foam cover has stretched with pt following PT directions   Current prosthetic wear tolerance (days/week)  daily   Current prosthetic wear tolerance (#hours/day)  all  awake hours   Residual limb condition  intact per pt.   Donning Prosthesis Modified independent (device/increased time)   Doffing Prosthesis Modified independent (device/increased time)                  PT Short Term Goals - 02/10/15 0930    PT SHORT TERM GOAL #1   Title donnes prosthesis correclty modified independent. (Target Date: 12/11/2014)   Baseline Met 12/07/14   Status Achieved   PT SHORT TERM GOAL #2   Title tolerates wear of  prosthesis >8hrs total per day without skin issues or discomfort. (Target Date: 12/11/2014)   Baseline Met 12/07/14   Status Achieved   PT SHORT TERM GOAL #3   Title standing balance with rolling walker & prosthesis reaching 10" all directions, to floor, and manages clothes including toileting modified independent. (Target Date: 12/11/2014)   Status Achieved   PT SHORT TERM GOAL #4   Title ambulates 150' with rolling walker & prosthesis with supervision. (Target Date: 12/11/2014)   Baseline Met 12/07/14   Status Achieved   PT SHORT TERM GOAL #5   Title negotiates ramp, curb with RW and stairs wtih 2 rails with prosthesis with supervision. (Target Date: 12/11/2014)   Status Achieved   PT SHORT TERM GOAL #6   Title Reaches 10" without UE assist safely. (Target Date: 02/03/2025)   Baseline 02/02/15: able to reach 10 inches out of base of support without UE support   Time 4   Period Weeks   Status Achieved   PT SHORT TERM GOAL #7   Title ambulates 300' with [facility] & prosthesis with contact assist. (Target Date: 02/03/2025)   Baseline MET 02/10/2015   Status Achieved   PT SHORT TERM GOAL #8   Title negotiates ramp & curb with [facility] & prosthesis with minA. (Target Date: 02/03/2025)   Baseline MET 02/10/2015   Status Achieved           PT Long Term Goals - 01/05/15 1015    PT LONG TERM GOAL #1   Title independent with prosthetic care. (Target Date: 01/08/2015)   Baseline MET 01/05/15   Time 8   Period Weeks   Status Achieved   PT LONG TERM GOAL #2   Title toelrates wear of prosthesis >90% of awake hours without issues. (NEW Target Date: 03/05/2015)   Baseline Partially MET 01/05/15 patient tolerates wear >90% of awake hours but has wound still present that is slowly healing.   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 (Target Date: 01/08/2015)   Baseline MET 01/05/15   Time 8   Period Weeks   Status Achieved   PT LONG TERM GOAL #4    Title ambulates 300' with single point cane & prosthesis modified independent. (NEW Target Date: 03/05/2015)   Baseline MET with crutches on 01/05/15; goal updated to single point cane   Time 8   Period Weeks   Status Revised   PT LONG TERM GOAL #5   Title negotiates ramp, curb and stairs with single point cane & prosthesis modified indepenent. (NEW Target Date: 03/05/2015)   Baseline Partially MET 01/05/15 patient is modified independent with RW; SBA with crutches & minA with cane.  Updated to single point cane.   Time 8   Period Weeks   Status Revised               Plan -  02/10/15 0930    Clinical Impression Statement Patient met remaining STGs with [facility] which she already owns. Patient improved ability to negotiate over & around obstacles with [facility] & prosthesis with verbal cues & demo by PT. Patient improved ability to carry item while walking.   Pt will benefit from skilled therapeutic intervention in order to improve on the following deficits Decreased activity tolerance;Decreased balance;Decreased endurance;Decreased knowledge of precautions;Decreased knowledge of use of DME;Decreased mobility;Decreased range of motion;Decreased strength;Abnormal gait   Rehab Potential Good   PT Frequency 1x / week   PT Duration 8 weeks   PT Treatment/Interventions ADLs/Self Care Home Management;DME Instruction;Gait training;Stair training;Functional mobility training;Therapeutic activities;Therapeutic exercise;Balance training;Neuromuscular re-education;Patient/family education;Other (comment)  prosthetic training   PT Next Visit Plan continue with [facility]   Consulted and Agree with Plan of Care Patient        Problem List There are no active problems to display for this patient.   Jamey Reas PT, DPT 02/10/2015, 6:15 PM  [place] [address] [place], [place], [address] Phone: [phone]   Fax:  [phone]

## 2015-02-16 ENCOUNTER — Encounter: Payer: Self-pay | Admitting: Physical Therapy

## 2015-02-16 ENCOUNTER — Ambulatory Visit: Payer: Medicare HMO | Admitting: Physical Therapy

## 2015-02-16 DIAGNOSIS — R269 Unspecified abnormalities of gait and mobility: Secondary | ICD-10-CM | POA: Diagnosis not present

## 2015-02-16 DIAGNOSIS — R6889 Other general symptoms and signs: Secondary | ICD-10-CM

## 2015-02-16 DIAGNOSIS — R2689 Other abnormalities of gait and mobility: Secondary | ICD-10-CM

## 2015-02-16 DIAGNOSIS — R531 Weakness: Secondary | ICD-10-CM

## 2015-02-16 NOTE — Therapy (Signed)
[facility] [address] [place], [place], [address] Phone: [phone]   Fax:  [phone]  Physical Therapy Treatment  Patient Details  Name: Natalie [name] MRN: [id number] Date of Birth: [birth year] Referring Provider:  Algernon Huxley, MD  Encounter Date: 02/16/2015      PT End of Session - 02/16/15 0940    Visit Number 19   Number of Visits 21   Date for PT Re-Evaluation 03/05/15   PT Start Time 0935   PT Stop Time 1015   PT Time Calculation (min) 40 min   Equipment Utilized During Treatment Gait belt   Activity Tolerance Patient tolerated treatment well   Behavior During Therapy [facility] for tasks assessed/performed      History reviewed. No pertinent past medical history.  History reviewed. No pertinent past surgical history.  There were no vitals filed for this visit.  Visit Diagnosis:  Abnormality of gait  Decreased functional activity tolerance  Balance problems  Generalized weakness      Subjective Assessment - 02/16/15 0940    Subjective No falls or pain to report. No new complaints.    Currently in Pain? No/denies           [facility] Adult PT Treatment/Exercise - 02/16/15 0941    Transfers   Sit to Stand 5: Supervision;With upper extremity assist;From bed   Stand to Sit 5: Supervision;With upper extremity assist;With armrests;To chair/3-in-1;Uncontrolled descent   Sit to Stand --   Ambulation/Gait   Ambulation/Gait Yes   Ambulation/Gait Assistance 4: Min guard;4: Min assist   Ambulation/Gait Assistance Details cues on posture and step length with gait   Ambulation Distance (Feet) 140 Feet   Assistive device Small based quad cane;Prosthesis   Gait Pattern Step-through pattern;Decreased step length - right;Decreased stance time - left;Decreased weight shift to left;Trunk flexed;Narrow base of support   Ambulation Surface Level;Indoor   Stairs Yes   Stairs Assistance 5: Supervision   Stairs  Assistance Details (indicate cue type and reason) cues on posture and hand advancement with stair negotiation   Stair Management Technique One rail Left;Step to pattern;Forwards;With cane   Number of Stairs 4   Dynamic Standing Balance   Dynamic Standing - Balance Support No upper extremity supported;During functional activity   Dynamic Standing - Level of Assistance 4: Min assist   Dynamic Standing - Balance Activities Foam balance beam;Eyes open;Eyes closed;Head turns;Head nods   Dynamic Standing - Comments standing across blue foam beam: eyes closed no head movements, eyes open head nods/shakes x 10 each with up to min assist for balance.                              High Level Balance   High Level Balance Activities Side stepping;Backward walking;Negotiating over obstacles   High Level Balance Comments with [facility] & prosthesis with cues on wt shift in stance, posture and weight shift over prosthesis with stepping over objects                     Prosthetics   Prosthetic Care Comments  Foam cover starting to come apart/tear at knee juntion, pt has called prosthetist, see's him Aug 1st to have it looked at. Advised pt to call if it get's any worse.                     Current prosthetic wear tolerance (days/week)  daily  Current prosthetic wear tolerance (#hours/day)  all awake hours   Residual limb condition  intact per pt.   Education Provided Residual limb care   Person(s) Educated Patient   Education Method Explanation;Demonstration   Education Method Verbalized understanding   Donning Prosthesis Modified independent (device/increased time)   Doffing Prosthesis Modified independent (device/increased time)            PT Short Term Goals - 02/10/15 0930    PT SHORT TERM GOAL #1   Title donnes prosthesis correclty modified independent. (Target Date: 12/11/2014)   Baseline Met 12/07/14   Status Achieved   PT SHORT TERM GOAL #2   Title tolerates wear of prosthesis >8hrs total per day  without skin issues or discomfort. (Target Date: 12/11/2014)   Baseline Met 12/07/14   Status Achieved   PT SHORT TERM GOAL #3   Title standing balance with rolling walker & prosthesis reaching 10" all directions, to floor, and manages clothes including toileting modified independent. (Target Date: 12/11/2014)   Status Achieved   PT SHORT TERM GOAL #4   Title ambulates 150' with rolling walker & prosthesis with supervision. (Target Date: 12/11/2014)   Baseline Met 12/07/14   Status Achieved   PT SHORT TERM GOAL #5   Title negotiates ramp, curb with RW and stairs wtih 2 rails with prosthesis with supervision. (Target Date: 12/11/2014)   Status Achieved   PT SHORT TERM GOAL #6   Title Reaches 10" without UE assist safely. (Target Date: 02/03/2025)   Baseline 02/02/15: able to reach 10 inches out of base of support without UE support   Time 4   Period Weeks   Status Achieved   PT SHORT TERM GOAL #7   Title ambulates 300' with [facility] & prosthesis with contact assist. (Target Date: 02/03/2025)   Baseline MET 02/10/2015   Status Achieved   PT SHORT TERM GOAL #8   Title negotiates ramp & curb with [facility] & prosthesis with minA. (Target Date: 02/03/2025)   Baseline MET 02/10/2015   Status Achieved           PT Long Term Goals - 01/05/15 1015    PT LONG TERM GOAL #1   Title independent with prosthetic care. (Target Date: 01/08/2015)   Baseline MET 01/05/15   Time 8   Period Weeks   Status Achieved   PT LONG TERM GOAL #2   Title toelrates wear of prosthesis >90% of awake hours without issues. (NEW Target Date: 03/05/2015)   Baseline Partially MET 01/05/15 patient tolerates wear >90% of awake hours but has wound still present that is slowly healing.   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 (Target Date: 01/08/2015)   Baseline MET 01/05/15   Time 8   Period Weeks   Status Achieved   PT LONG TERM GOAL #4   Title ambulates 300' with single  point cane & prosthesis modified independent. (NEW Target Date: 03/05/2015)   Baseline MET with crutches on 01/05/15; goal updated to single point cane   Time 8   Period Weeks   Status Revised   PT LONG TERM GOAL #5   Title negotiates ramp, curb and stairs with single point cane & prosthesis modified indepenent. (NEW Target Date: 03/05/2015)   Baseline Partially MET 01/05/15 patient is modified independent with RW; SBA with crutches & minA with cane.  Updated to single point cane.   Time  8   Period Weeks   Status Revised           Plan - 02/16/15 0940    Clinical Impression Statement Pt continues to make steady progress toward goals with less assistance/cues needed with use of SBQC.    Pt will benefit from skilled therapeutic intervention in order to improve on the following deficits Decreased activity tolerance;Decreased balance;Decreased endurance;Decreased knowledge of precautions;Decreased knowledge of use of DME;Decreased mobility;Decreased range of motion;Decreased strength;Abnormal gait   Rehab Potential Good   PT Frequency 1x / week   PT Duration 8 weeks   PT Treatment/Interventions ADLs/Self Care Home Management;DME Instruction;Gait training;Stair training;Functional mobility training;Therapeutic activities;Therapeutic exercise;Balance training;Neuromuscular re-education;Patient/family education;Other (comment)  prosthetic training   PT Next Visit Plan continue with  [facility] and with balance activities   Consulted and Agree with Plan of Care Patient        Problem List There are no active problems to display for this patient.   Willow Ora 02/16/2015, 1:54 PM  Willow Ora, PTA, [place] [address], [place], [place] [address] [phone] 02/16/2015, 1:54 PM

## 2015-02-17 ENCOUNTER — Encounter: Payer: Medicare HMO | Admitting: Physical Therapy

## 2015-02-23 ENCOUNTER — Ambulatory Visit: Payer: Medicare HMO | Admitting: Physical Therapy

## 2015-02-23 ENCOUNTER — Encounter: Payer: Self-pay | Admitting: Physical Therapy

## 2015-02-23 DIAGNOSIS — R531 Weakness: Secondary | ICD-10-CM

## 2015-02-23 DIAGNOSIS — R6889 Other general symptoms and signs: Secondary | ICD-10-CM

## 2015-02-23 DIAGNOSIS — Z89612 Acquired absence of left leg above knee: Secondary | ICD-10-CM

## 2015-02-23 DIAGNOSIS — R269 Unspecified abnormalities of gait and mobility: Secondary | ICD-10-CM | POA: Diagnosis not present

## 2015-02-23 DIAGNOSIS — R2689 Other abnormalities of gait and mobility: Secondary | ICD-10-CM

## 2015-02-23 NOTE — Therapy (Signed)
[facility] [address] [place], [place], [address] Phone: [phone]   Fax:  [phone]  Physical Therapy Treatment  Patient Details  Name: Natalie [name] MRN: [id number] Date of Birth: [birth year] Referring Provider:  Algernon Huxley, MD  Encounter Date: 02/23/2015      PT End of Session - 02/23/15 0930    Visit Number 20   Number of Visits 21   Date for PT Re-Evaluation 03/05/15   PT Start Time 0930   PT Stop Time 1015   PT Time Calculation (min) 45 min   Equipment Utilized During Treatment Gait belt   Activity Tolerance Patient tolerated treatment well   Behavior During Therapy [facility] for tasks assessed/performed      History reviewed. No pertinent past medical history.  History reviewed. No pertinent past surgical history.  There were no vitals filed for this visit.  Visit Diagnosis:  Abnormality of gait  Decreased functional activity tolerance  Balance problems  Generalized weakness  Status post above knee amputation of left lower extremity      Subjective Assessment - 02/23/15 0930    Subjective She has been using cane in the house but not outside as she is usually alone which makes her fearful.   Currently in Pain? No/denies                         [facility] Adult PT Treatment/Exercise - 02/23/15 0930    Transfers   Sit to Stand 6: Modified independent (Device/Increase time);With upper extremity assist;From chair/3-in-1   Stand to Sit 6: Modified independent (Device/Increase time);With upper extremity assist;To chair/3-in-1   Ambulation/Gait   Ambulation/Gait Yes   Ambulation/Gait Assistance 5: Supervision   Ambulation/Gait Assistance Details worked on scanning environment while ambulating, mis-step 2x with pt self-correcting   Ambulation Distance (Feet) 250 Feet  250', 150', 125'   Assistive device Small based quad cane;Prosthesis   Gait Pattern Step-through pattern;Decreased  step length - right;Decreased stance time - left;Decreased weight shift to left;Trunk flexed;Narrow base of support   Ambulation Surface Indoor;Level   Stairs Yes   Stairs Assistance 5: Supervision   Stairs Assistance Details (indicate cue type and reason) cues on wt shift   Stair Management Technique One rail Left;Step to pattern;Forwards;With cane   Number of Stairs 4   Ramp 5: Supervision  LBQC & prosthesis   Ramp Details (indicate cue type and reason) verbal cues on technique   Curb 5: Supervision  SBQC & prosthesis    Curb Details (indicate cue type and reason) verbal cues on sequence   Dynamic Standing Balance   Dynamic Standing - Balance Support --   Dynamic Standing - Level of Assistance --   Dynamic Standing - Balance Activities --   High Level Balance   High Level Balance Activities Side stepping;Backward walking;Negotiating over obstacles;Negotitating around obstacles;Other (comment)  walking on compliant surfaces   High Level Balance Comments with [facility] & prosthesis with cues on wt shift in stance, posture and weight shift over prosthesis with stepping over objects                     Prosthetics   Prosthetic Care Comments  Foam cover starting to come apart/tear at knee juntion, pt has called prosthetist, see's him Aug 1st to have it looked at. Advised pt to call if it get's any worse.  Current prosthetic wear tolerance (days/week)  daily   Current prosthetic wear tolerance (#hours/day)  all awake hours   Residual limb condition  intact per pt.   Education Provided Residual limb care                PT Education - 02/23/15 0930    Education provided Yes   Education Details returning to driving is not limited with left side amputation other than an automatic   Person(s) Educated Patient   Methods Explanation   Comprehension Verbalized understanding          PT Short Term Goals - 02/10/15 0930    PT SHORT TERM GOAL #1   Title donnes  prosthesis correclty modified independent. (Target Date: 12/11/2014)   Baseline Met 12/07/14   Status Achieved   PT SHORT TERM GOAL #2   Title tolerates wear of prosthesis >8hrs total per day without skin issues or discomfort. (Target Date: 12/11/2014)   Baseline Met 12/07/14   Status Achieved   PT SHORT TERM GOAL #3   Title standing balance with rolling walker & prosthesis reaching 10" all directions, to floor, and manages clothes including toileting modified independent. (Target Date: 12/11/2014)   Status Achieved   PT SHORT TERM GOAL #4   Title ambulates 150' with rolling walker & prosthesis with supervision. (Target Date: 12/11/2014)   Baseline Met 12/07/14   Status Achieved   PT SHORT TERM GOAL #5   Title negotiates ramp, curb with RW and stairs wtih 2 rails with prosthesis with supervision. (Target Date: 12/11/2014)   Status Achieved   PT SHORT TERM GOAL #6   Title Reaches 10" without UE assist safely. (Target Date: 02/03/2025)   Baseline 02/02/15: able to reach 10 inches out of base of support without UE support   Time 4   Period Weeks   Status Achieved   PT SHORT TERM GOAL #7   Title ambulates 300' with [facility] & prosthesis with contact assist. (Target Date: 02/03/2025)   Baseline MET 02/10/2015   Status Achieved   PT SHORT TERM GOAL #8   Title negotiates ramp & curb with [facility] & prosthesis with minA. (Target Date: 02/03/2025)   Baseline MET 02/10/2015   Status Achieved           PT Long Term Goals - 01/05/15 1015    PT LONG TERM GOAL #1   Title independent with prosthetic care. (Target Date: 01/08/2015)   Baseline MET 01/05/15   Time 8   Period Weeks   Status Achieved   PT LONG TERM GOAL #2   Title toelrates wear of prosthesis >90% of awake hours without issues. (NEW Target Date: 03/05/2015)   Baseline Partially MET 01/05/15 patient tolerates wear >90% of awake hours but has wound still present that is slowly healing.   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 (Target Date: 01/08/2015)   Baseline MET 01/05/15   Time 8   Period Weeks   Status Achieved   PT LONG TERM GOAL #4   Title ambulates 300' with single point cane & prosthesis modified independent. (NEW Target Date: 03/05/2015)   Baseline MET with crutches on 01/05/15; goal updated to single point cane   Time 8   Period Weeks   Status Revised   PT LONG TERM GOAL #5   Title negotiates ramp, curb and stairs with single point cane & prosthesis modified  indepenent. (NEW Target Date: 03/05/2015)   Baseline Partially MET 01/05/15 patient is modified independent with RW; SBA with crutches & minA with cane.  Updated to single point cane.   Time 8   Period Weeks   Status Revised               Plan - Mar 07, 2015 0930    Clinical Impression Statement Patient improved gait with [facility] & prosthesis with supervision for all activities including scanning & carrying a conversation while ambulating. Patient appears ready for discharge next week.   Pt will benefit from skilled therapeutic intervention in order to improve on the following deficits Decreased activity tolerance;Decreased balance;Decreased endurance;Decreased knowledge of precautions;Decreased knowledge of use of DME;Decreased mobility;Decreased range of motion;Decreased strength;Abnormal gait   Rehab Potential Good   PT Frequency 1x / week   PT Duration 8 weeks   PT Treatment/Interventions ADLs/Self Care Home Management;DME Instruction;Gait training;Stair training;Functional mobility training;Therapeutic activities;Therapeutic exercise;Balance training;Neuromuscular re-education;Patient/family education;Other (comment)  prosthetic training   PT Next Visit Plan assess for discharge   Consulted and Agree with Plan of Care Patient          G-Codes - 07-Mar-2015 0930    Functional Assessment Tool Used donnes prosthesis independently, wears prosthesis >90% of awake hours without issues, verbalizes proper  prosthetic care   Functional Limitation Self care   Self Care Current Status (O4859) At least 1 percent but less than 20 percent impaired, limited or restricted   Self Care Goal Status (C7639) At least 1 percent but less than 20 percent impaired, limited or restricted      Problem List There are no active problems to display for this patient.   Shefali Ng PT, DPT 07-Mar-2015, 1:03 PM  [facility] [address] [place], [place], [address] Phone: [phone]   Fax:  [phone]

## 2015-02-23 NOTE — Therapy (Signed)
[facility] [address] [place], [place], [address] Phone: [phone]   Fax:  [phone]  Patient Details  Name: Natalie [name] MRN: [id number] Date of Birth: 07-Feb-1956 Referring Provider:  No ref. provider found  Encounter Date: 02/23/2015  Physical Therapy Progress Note  Dates of Reporting Period: 12/30/2014 to 02/23/2015  Objective Reports of Subjective Statement: patient reports wearing prosthesis all awake hours without issues. She ambulates in her home with [facility] & prosthesis and outside her home she uses crutches with prosthesis  Objective Measurements: Patient ambulates 250' with [facility] and prosthesis with supervision including negotiating ramps, curbs, over obstacles, around obstacles. She can stand without UE support, reach >7", reach to floor with prosthesis safely without assistance.  Goal Update:      PT Long Term Goals - 01/05/15 1015    PT LONG TERM GOAL #1   Title independent with prosthetic care. (Target Date: 01/08/2015)   Baseline MET 01/05/15   Time 8   Period Weeks   Status Achieved   PT LONG TERM GOAL #2   Title toelrates wear of prosthesis >90% of awake hours without issues. (NEW Target Date: 03/05/2015)   Baseline Partially MET 01/05/15 patient tolerates wear >90% of awake hours but has wound still present that is slowly healing.   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 (Target Date: 01/08/2015)   Baseline MET 01/05/15   Time 8   Period Weeks   Status Achieved   PT LONG TERM GOAL #4   Title ambulates 300' with single point cane & prosthesis modified independent. (NEW Target Date: 03/05/2015)   Baseline Progressing ambulates 250' with [facility] with supervision.   Time 8   Period Weeks   Status Revised   PT LONG TERM GOAL #5   Title negotiates ramp, curb and stairs with single point cane & prosthesis modified indepenent. (NEW Target  Date: 03/05/2015)   Baseline Progressing negotiates with [facility] & prosthesis with supervision   Time 8   Period Weeks   Status Revised      Plan: Continue 1 more week for prosthetic care. Assess LTGs  Reason Skilled Services are Required: Patient needs skilled instructions to progress to using prosthesis at community level with cane to maximize optimal function.   Jamey Reas 02/23/2015, 1:29 PM  [facility] [address] [place], [place], [address] Phone: [phone]   Fax:  [phone]

## 2015-02-24 ENCOUNTER — Encounter: Payer: Medicare HMO | Admitting: Physical Therapy

## 2015-03-02 ENCOUNTER — Ambulatory Visit: Payer: Medicare HMO | Admitting: Physical Therapy

## 2015-03-02 ENCOUNTER — Encounter: Payer: Self-pay | Admitting: Physical Therapy

## 2015-03-02 DIAGNOSIS — R531 Weakness: Secondary | ICD-10-CM

## 2015-03-02 DIAGNOSIS — R2689 Other abnormalities of gait and mobility: Secondary | ICD-10-CM

## 2015-03-02 DIAGNOSIS — R269 Unspecified abnormalities of gait and mobility: Secondary | ICD-10-CM

## 2015-03-02 DIAGNOSIS — R6889 Other general symptoms and signs: Secondary | ICD-10-CM

## 2015-03-02 DIAGNOSIS — Z89612 Acquired absence of left leg above knee: Secondary | ICD-10-CM

## 2015-03-02 NOTE — Therapy (Signed)
[facility] [address] [place], [place], [address] Phone: [phone]   Fax:  [phone]  Physical Therapy Treatment  Patient Details  Name: Natalie [name] MRN: [id number] Date of Birth: [birth year] Referring Provider:  Algernon Huxley, MD  Encounter Date: 03/02/2015      PT End of Session - 03/02/15 1015    Visit Number 21   Number of Visits 21   Date for PT Re-Evaluation 03/05/15   PT Start Time 0930   PT Stop Time 1015   PT Time Calculation (min) 45 min   Equipment Utilized During Treatment Gait belt   Activity Tolerance Patient tolerated treatment well   Behavior During Therapy [facility] for tasks assessed/performed      History reviewed. No pertinent past medical history.  History reviewed. No pertinent past surgical history.  There were no vitals filed for this visit.  Visit Diagnosis:  Abnormality of gait  Decreased functional activity tolerance  Balance problems  Generalized weakness  Status post above knee amputation of left lower extremity      Subjective Assessment - 03/02/15 1027    Subjective She has been using cane everywhere. No falls.   Currently in Pain? No/denies                         [facility] Adult PT Treatment/Exercise - 03/02/15 1015    Transfers   Sit to Stand 6: Modified independent (Device/Increase time);With upper extremity assist;From chair/3-in-1   Stand to Sit 6: Modified independent (Device/Increase time);With upper extremity assist;To chair/3-in-1   Ambulation/Gait   Ambulation/Gait Yes   Ambulation/Gait Assistance 6: Modified independent (Device/Increase time)   Ambulation Distance (Feet) 350 Feet   Assistive device Small based quad cane;Prosthesis   Gait Pattern Step-through pattern;Decreased step length - right;Decreased stance time - left;Decreased weight shift to left;Trunk flexed   Ambulation Surface Indoor;Level   Gait velocity 1.02 ft/sec   Stairs  Yes   Stairs Assistance 6: Modified independent (Device/Increase time)   Stairs Assistance Details (indicate cue type and reason) PT instructed how to neg stairs without rails with hand hold assist of husband. Husband verbalized understanding and pt return demo understanding.   Stair Management Technique One rail Left;Step to pattern;Forwards;With cane;No rails   Number of Stairs 4   Ramp 6: Modified independent (Device)  SBQC & prosthesis   Curb 6: Modified independent (Device/increase time)  SBQC & prosthesis    Standardized Balance Assessment   Standardized Balance Assessment Berg Balance Test   Berg Balance Test   Sit to Stand Able to stand without using hands and stabilize independently   Standing Unsupported Able to stand safely 2 minutes   Sitting with Back Unsupported but Feet Supported on Floor or Stool Able to sit safely and securely 2 minutes   Stand to Sit Sits safely with minimal use of hands   Transfers Able to transfer safely, minor use of hands   Standing Unsupported with Eyes Closed Able to stand 10 seconds safely   Standing Ubsupported with Feet Together Able to place feet together independently and stand 1 minute safely   From Standing, Reach Forward with Outstretched Arm Can reach confidently >25 cm (10")   From Standing Position, Pick up Object from Floor Able to pick up shoe safely and easily   From Standing Position, Turn to Look Behind Over each Shoulder Looks behind one side only/other side shows less weight shift   Turn 360 Degrees Needs  close supervision or verbal cueing  one direction couterclockwise is safe but slow   Standing Unsupported, Alternately Place Feet on Step/Stool Needs assistance to keep from falling or unable to try   Standing Unsupported, One Foot in [place] to take small step independently and hold 30 seconds   Standing on One Leg Able to lift leg independently and hold equal to or more than 3 seconds   Total Score 44   High Level Balance    High Level Balance Activities --   High Level Balance Comments --   Prosthetics   Prosthetic Care Comments  --   Current prosthetic wear tolerance (days/week)  daily   Current prosthetic wear tolerance (#hours/day)  all awake hours   Residual limb condition  intact per pt.   Education Provided --                PT Education - 03/02/15 1015    Education provided Yes   Education Details ongoing activity level and follow up with prosthetist   Person(s) Educated Patient   Methods Explanation   Comprehension Verbalized understanding          PT Short Term Goals - 02/10/15 0930    PT SHORT TERM GOAL #1   Title donnes prosthesis correclty modified independent. (Target Date: 12/11/2014)   Baseline Met 12/07/14   Status Achieved   PT SHORT TERM GOAL #2   Title tolerates wear of prosthesis >8hrs total per day without skin issues or discomfort. (Target Date: 12/11/2014)   Baseline Met 12/07/14   Status Achieved   PT SHORT TERM GOAL #3   Title standing balance with rolling walker & prosthesis reaching 10" all directions, to floor, and manages clothes including toileting modified independent. (Target Date: 12/11/2014)   Status Achieved   PT SHORT TERM GOAL #4   Title ambulates 150' with rolling walker & prosthesis with supervision. (Target Date: 12/11/2014)   Baseline Met 12/07/14   Status Achieved   PT SHORT TERM GOAL #5   Title negotiates ramp, curb with RW and stairs wtih 2 rails with prosthesis with supervision. (Target Date: 12/11/2014)   Status Achieved   PT SHORT TERM GOAL #6   Title Reaches 10" without UE assist safely. (Target Date: 02/03/2025)   Baseline 02/02/15: able to reach 10 inches out of base of support without UE support   Time 4   Period Weeks   Status Achieved   PT SHORT TERM GOAL #7   Title ambulates 300' with [facility] & prosthesis with contact assist. (Target Date: 02/03/2025)   Baseline MET 02/10/2015   Status Achieved   PT SHORT TERM GOAL #8   Title negotiates ramp &  curb with [facility] & prosthesis with minA. (Target Date: 02/03/2025)   Baseline MET 02/10/2015   Status Achieved           PT Long Term Goals - 03/02/15 1015    PT LONG TERM GOAL #1   Title independent with prosthetic care. (Target Date: 01/08/2015)   Baseline MET 01/05/15   Time 8   Period Weeks   Status Achieved   PT LONG TERM GOAL #2   Title toelrates wear of prosthesis >90% of awake hours without issues. (NEW Target Date: 03/05/2015)   Baseline MET 03/02/2015 no wounds, no tenderness, wears all awake hours   Time 8   Period Weeks   Status Achieved   PT LONG TERM GOAL #3   Title Berg Balance >24/56 with prosthesis. (Target Date: 01/08/2015)  Baseline MET 01/05/15  Mar 15, 2105 Berg Balance 44/56   Time 8   Period Weeks   Status Achieved   PT LONG TERM GOAL #4   Title ambulates 300' with SBQC & prosthesis modified independent. (NEW Target Date: 03/05/2015)   Baseline MET 03-16-2015   Time 8   Period Weeks   Status Achieved   PT LONG TERM GOAL #5   Title negotiates ramp, curb and stairs with SBQC & prosthesis modified indepenent. (NEW Target Date: 03/05/2015)   Baseline MET 03-16-15   Time 8   Period Weeks   Status Achieved               Plan - 03/16/15 1015    Clinical Impression Statement Patient met all LTGs. She is safely functioning with [facility] and prosthesis at community level. Patient improved Berg Balance test from 8/56 at evaluation to 44/56 today. A score <45/56 indicates risk of falls but an improvement of 36 points indicates significant improvement in balance.   Pt will benefit from skilled therapeutic intervention in order to improve on the following deficits Decreased activity tolerance;Decreased balance;Decreased endurance;Decreased knowledge of precautions;Decreased knowledge of use of DME;Decreased mobility;Decreased range of motion;Decreased strength;Abnormal gait   Rehab Potential Good   PT Frequency 1x / week   PT Duration 8 weeks   PT Treatment/Interventions  ADLs/Self Care Home Management;DME Instruction;Gait training;Stair training;Functional mobility training;Therapeutic activities;Therapeutic exercise;Balance training;Neuromuscular re-education;Patient/family education;Other (comment)  prosthetic training   PT Next Visit Plan discharge today   Consulted and Agree with Plan of Care Patient          G-Codes - March 16, 2015 10/06/35    Functional Assessment Tool Used donnes prosthesis independently, wears prosthesis >90% of awake hours without issues, verbalizes proper prosthetic care   Functional Limitation Self care   Self Care Goal Status ([id number]) At least 1 percent but less than 20 percent impaired, limited or restricted   Self Care Discharge Status [phone]) At least 1 percent but less than 20 percent impaired, limited or restricted      Problem List There are no active problems to display for this patient.   Jamey Reas PT, DPT 03-16-15, 12:38 PM  [facility] [address] [place], [place], [address] Phone: [phone]   Fax:  [phone]

## 2015-03-02 NOTE — Therapy (Signed)
[facility] [address] [place], [place], [address] Phone: [phone]   Fax:  [phone]  Patient Details  Name: Natalie [name] MRN: [id number] Date of Birth: 1955-10-08 Referring Provider:  No ref. provider found  Encounter Date: 03/02/2015 PHYSICAL THERAPY DISCHARGE SUMMARY  Visits from Start of Care: 21  Current functional level related to goals / functional outcomes:     PT Long Term Goals - 03/02/15 1015    PT LONG TERM GOAL #1   Title independent with prosthetic care. (Target Date: 01/08/2015)   Baseline MET 01/05/15   Time 8   Period Weeks   Status Achieved   PT LONG TERM GOAL #2   Title toelrates wear of prosthesis >90% of awake hours without issues. (NEW Target Date: 03/05/2015)   Baseline MET 03/02/2015 no wounds, no tenderness, wears all awake hours   Time 8   Period Weeks   Status Achieved   PT LONG TERM GOAL #3   Title Berg Balance >24/56 with prosthesis. (Target Date: 01/08/2015)   Baseline MET 01/05/15  03/01/2105 Berg Balance 44/56   Time 8   Period Weeks   Status Achieved   PT LONG TERM GOAL #4   Title ambulates 300' with SBQC & prosthesis modified independent. (NEW Target Date: 03/05/2015)   Baseline MET 03/02/2015   Time 8   Period Weeks   Status Achieved   PT LONG TERM GOAL #5   Title negotiates ramp, curb and stairs with SBQC & prosthesis modified indepenent. (NEW Target Date: 03/05/2015)   Baseline MET 03/02/2015   Time 8   Period Weeks   Status Achieved       Remaining deficits: Patient met all LTGs. She is safely functioning with [facility] and prosthesis at community level. Patient improved Berg Balance test from 8/56 at evaluation to 44/56 today. A score <45/56 indicates risk of falls but an improvement of 36 points indicates significant improvement in balance.   Education / Equipment: Prosthetic care & HEP Plan: Patient agrees to discharge.  Patient goals were met. Patient is being  discharged due to meeting the stated rehab goals.  ?????        Jarod Bozzo PT, DPT 03/02/2015, 12:41 PM  [facility] [address] [place], [place], [address] Phone: [phone]   Fax:  [phone]

## 2015-03-03 ENCOUNTER — Encounter: Payer: Medicare HMO | Admitting: Physical Therapy

## 2015-03-09 ENCOUNTER — Ambulatory Visit: Payer: Medicare HMO | Admitting: Physical Therapy

## 2015-04-30 IMAGING — CT CT HEAD WITHOUT CONTRAST
1 series · 15 of 30 positions shown, 19 images · non-contrast
Comparison: None.

CLINICAL DATA: Post vascular procedure this morning, now with
numbness and weakness involving the left lower leg

EXAM:
CT HEAD WITHOUT CONTRAST
TECHNIQUE: Contiguous axial images were obtained from the base of the skull
through the vertex without intravenous contrast.

[Series 2: head wo · axial · 0.43mm/px · z∈[+238,+364]mm · 15 of 32 slices shown, 19 images]
[im 2/32  brain]
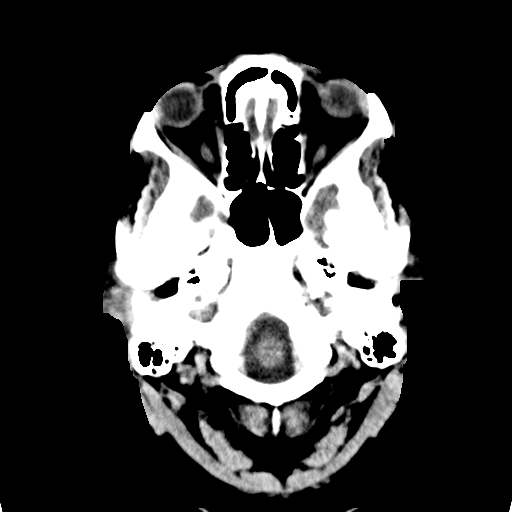
[im 2/32  bone]
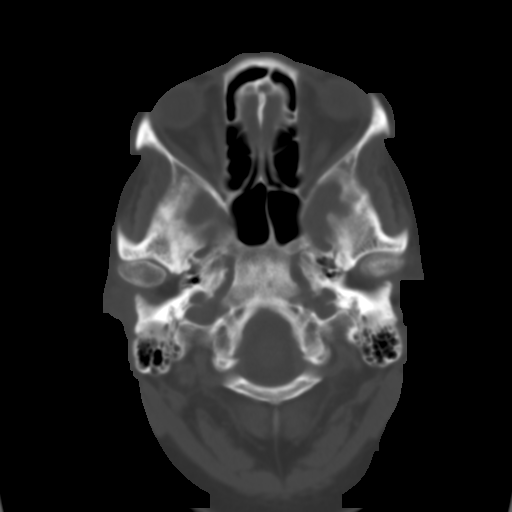
[im 4/32  brain]
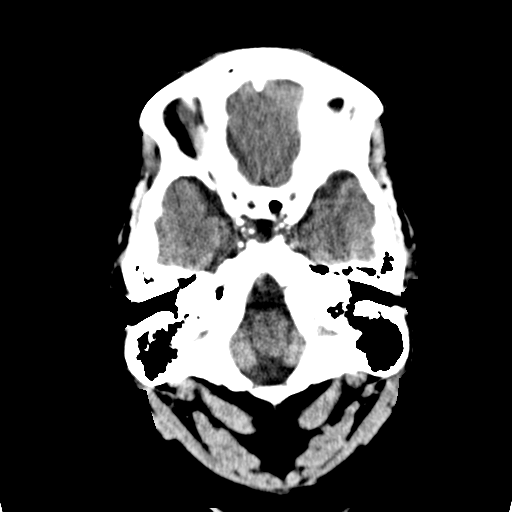
[im 6/32  brain]
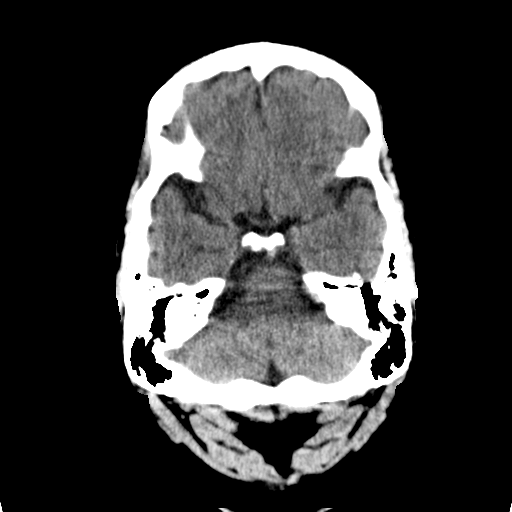
[im 8/32  brain]
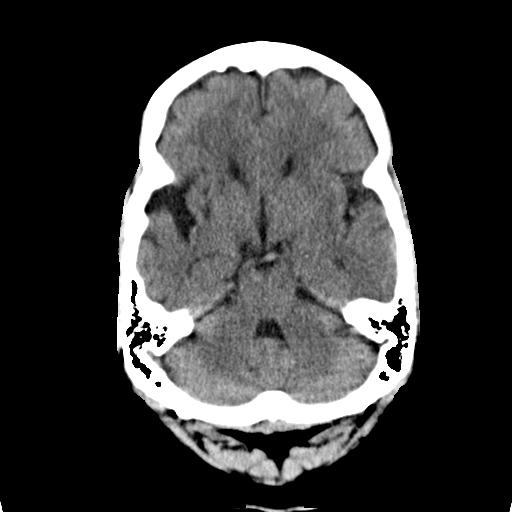
[im 10/32  brain]
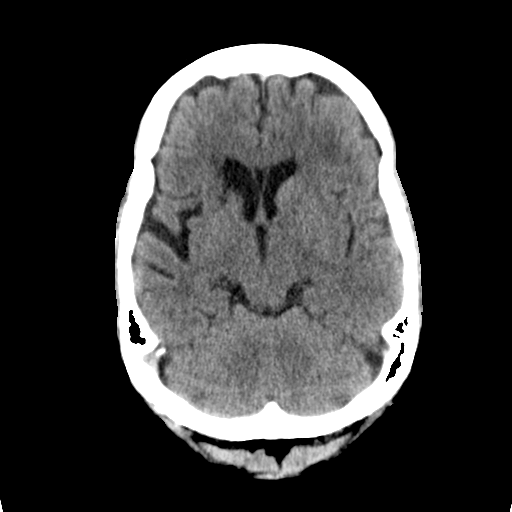
[im 10/32  bone]
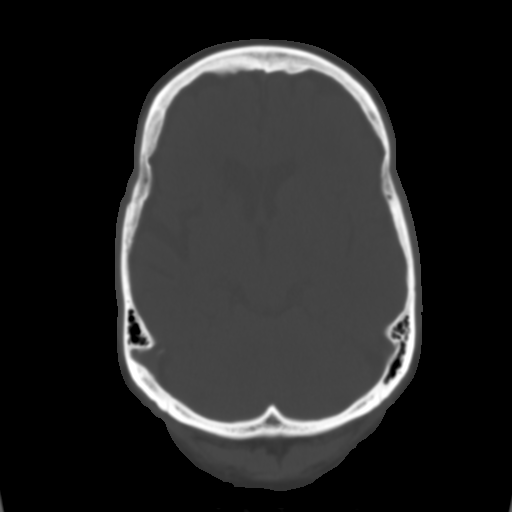
[im 12/32  brain]
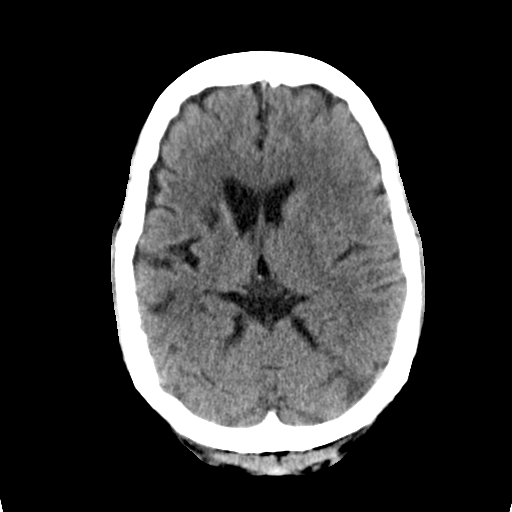
[im 14/32  brain]
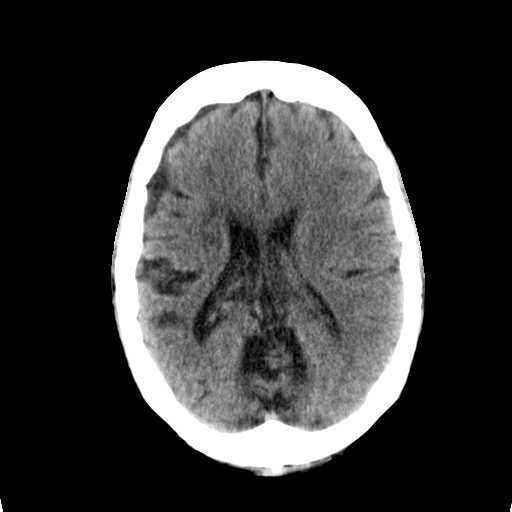
[im 17/32  brain]
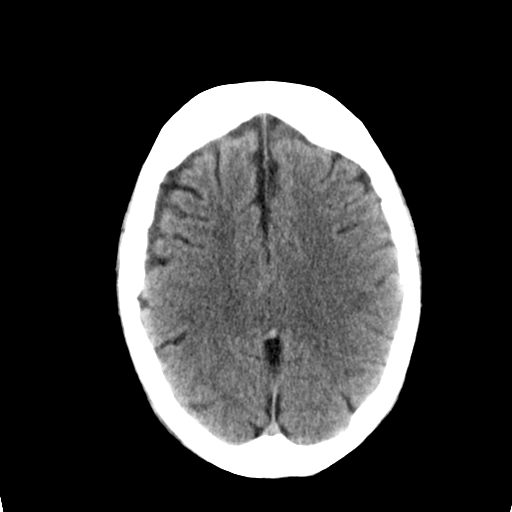
[im 18/32  brain]
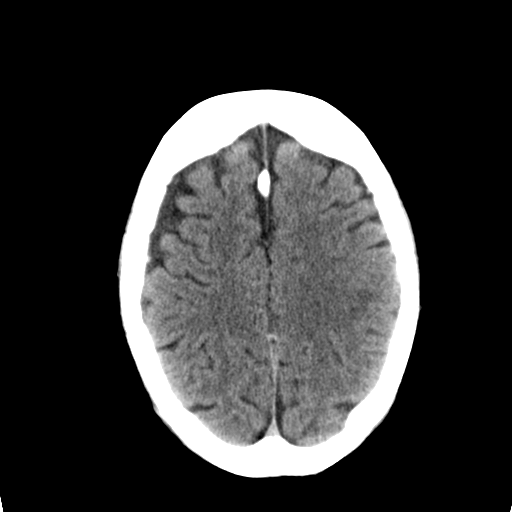
[im 18/32  bone]
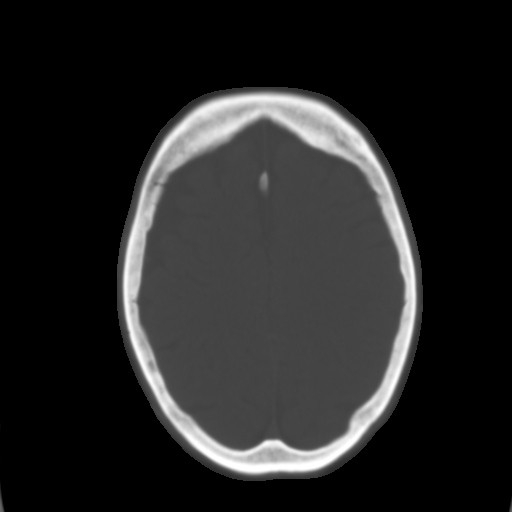
[im 20/32  brain]
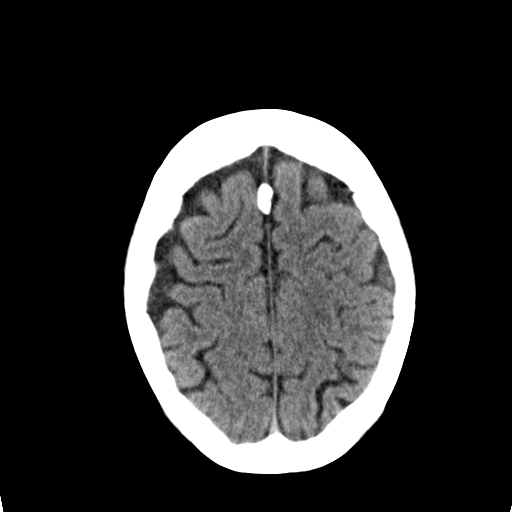
[im 22/32  brain]
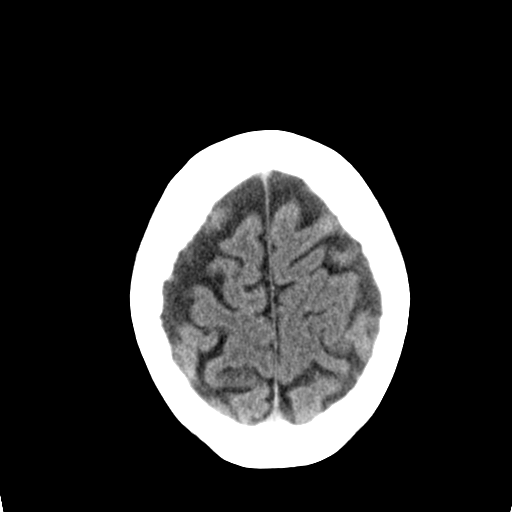
[im 24/32  brain]
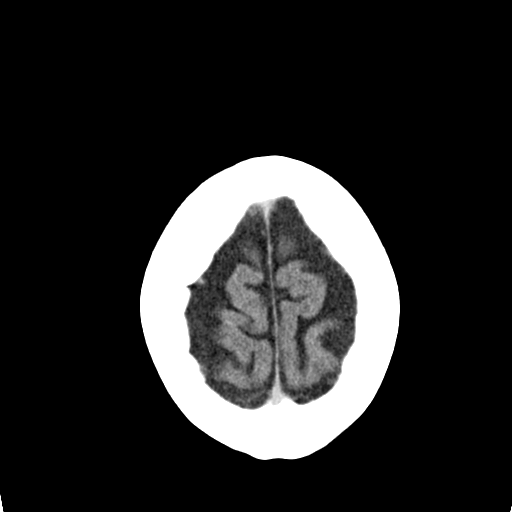
[im 26/32  brain]
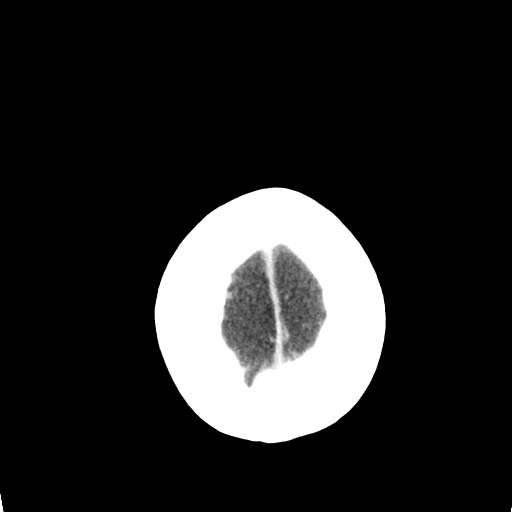
[im 26/32  bone]
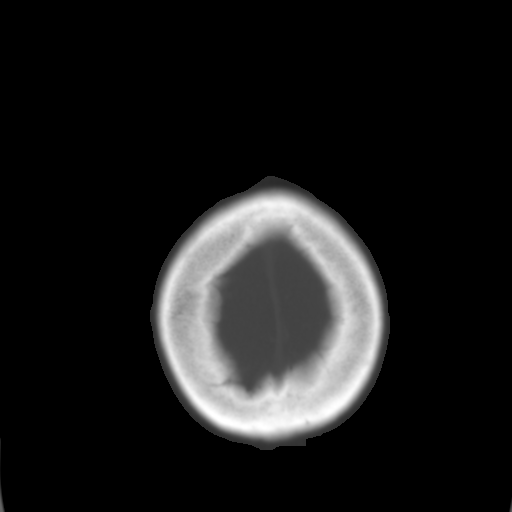
[im 28/32  brain]
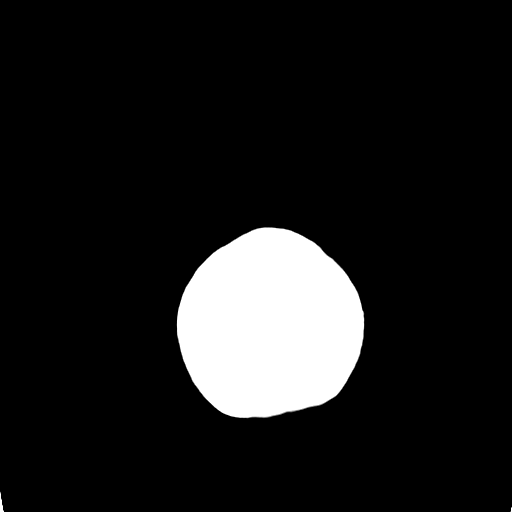
[im 30/32  brain]
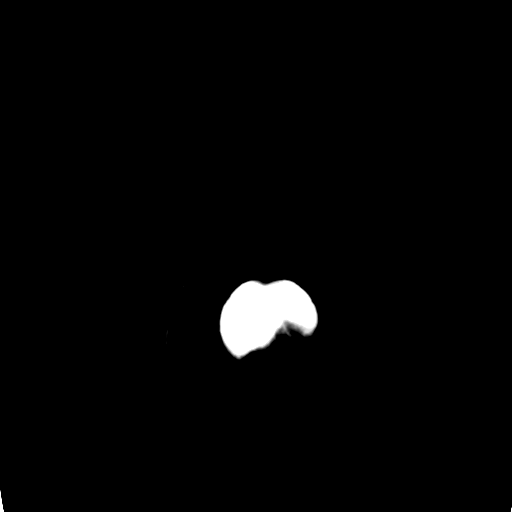

[15 of 30 positions shown; findings below may reference images not displayed]

FINDINGS: There is age advanced atrophy with sulcal prominence and mild
asymmetric prominence of the right frontal and high parietal
extra-axial spaces. Old lacunar infarct within the right basal
ganglia (image 12, series 2). There are extensive scattered
periventricular hypodensities, right greater than left. Given
background parenchymal abnormalities, there is no CT evidence of
acute large territory infarct. No intraparenchymal or extra-axial
mass or hemorrhage. Normal size and configuration of the ventricles
and basilar cisterns. No midline shift. Intracranial
atherosclerosis. There is under pneumatization of the bilateral
frontal sinuses. The remaining paranasal sinuses and mastoid air
cells appear normally aerated. No air-fluid levels. Regional soft
tissues appear normal. No displaced calvarial fracture.
IMPRESSION: Advanced atrophy and microvascular ischemic disease without acute
intracranial process.

## 2015-08-12 ENCOUNTER — Emergency Department (HOSPITAL_COMMUNITY)
Admission: EM | Admit: 2015-08-12 | Discharge: 2015-08-12 | Disposition: A | Discharge status: Home / Self Care | Payer: Medicare HMO | Attending: Emergency Medicine | Admitting: Emergency Medicine

## 2015-08-12 ENCOUNTER — Encounter (HOSPITAL_COMMUNITY): Payer: Self-pay

## 2015-08-12 DIAGNOSIS — Z7951 Long term (current) use of inhaled steroids: Secondary | ICD-10-CM | POA: Diagnosis not present

## 2015-08-12 DIAGNOSIS — Z792 Long term (current) use of antibiotics: Secondary | ICD-10-CM | POA: Insufficient documentation

## 2015-08-12 DIAGNOSIS — F17200 Nicotine dependence, unspecified, uncomplicated: Secondary | ICD-10-CM | POA: Diagnosis not present

## 2015-08-12 DIAGNOSIS — I10 Essential (primary) hypertension: Secondary | ICD-10-CM | POA: Insufficient documentation

## 2015-08-12 DIAGNOSIS — E7800 Pure hypercholesterolemia, unspecified: Secondary | ICD-10-CM | POA: Insufficient documentation

## 2015-08-12 DIAGNOSIS — R51 Headache: Secondary | ICD-10-CM | POA: Diagnosis not present

## 2015-08-12 DIAGNOSIS — Z79899 Other long term (current) drug therapy: Secondary | ICD-10-CM | POA: Diagnosis not present

## 2015-08-12 DIAGNOSIS — H5711 Ocular pain, right eye: Secondary | ICD-10-CM | POA: Diagnosis present

## 2015-08-12 DIAGNOSIS — L03213 Periorbital cellulitis: Secondary | ICD-10-CM | POA: Insufficient documentation

## 2015-08-12 HISTORY — DX: Essential (primary) hypertension: I10

## 2015-08-12 HISTORY — DX: Pure hypercholesterolemia, unspecified: E78.00

## 2015-08-12 MED ORDER — HYDROCODONE-ACETAMINOPHEN 5-325 MG PO TABS: 1.0000 | ORAL_TABLET | Freq: Four times a day (QID) | ORAL | Status: DC | PRN

## 2015-08-12 MED ORDER — DOXYCYCLINE HYCLATE 100 MG PO CAPS: 100.0000 mg | ORAL_CAPSULE | Freq: Two times a day (BID) | ORAL | Status: DC

## 2015-08-12 NOTE — ED Notes (Signed)
Pt sent here from [facility] for further evaluation of periorbital soft tissue cellulitis.

## 2015-08-12 NOTE — ED Provider Notes (Signed)
CSN: [id number]     Arrival date & time 08/12/15  1443 History   First MD Initiated Contact with Patient 08/12/15 1508     Chief Complaint  Patient presents with  . Eye Pain     (Consider location/radiation/quality/duration/timing/severity/associated sxs/prior Treatment) Patient is a 60 y.o. female presenting with eye pain. The history is provided by the patient.  Eye Pain Associated symptoms include headaches. Pertinent negatives include no chest pain, no abdominal pain and no shortness of breath.   patient with onset of skin cyst to the lateral aspect of her right eye 2 days ago. Patient seen and [facility] yesterday started on Augmentin as well as given IM Rocephin. Seen again today redness is spreading to the lower eyelid area.  Patient seen again in urgent care today and referred here for concerns for orbital cellulitis. Patient denies any pain with movement of the eyes. No fevers. Does have a mild headache mostly around the area where the skin cyst is. Patient states that the Augmentin which just started yesterday has the made any difference.  Past Medical History  Diagnosis Date  . Hypertension   . Hypercholesterolemia    Past Surgical History  Procedure Laterality Date  . Above the knee amputation     No family history on file. Social History  Substance Use Topics  . Smoking status: Current Some Day Smoker  . Smokeless tobacco: None  . Alcohol Use: No   OB History    No data available     Review of Systems  Constitutional: Negative for fever.  HENT: Positive for facial swelling. Negative for congestion and trouble swallowing.   Eyes: Negative for pain, redness and visual disturbance.  Respiratory: Negative for shortness of breath.   Cardiovascular: Negative for chest pain.  Gastrointestinal: Negative for nausea, vomiting and abdominal pain.  Genitourinary: Negative for dysuria.  Musculoskeletal: Negative for back pain, neck pain and neck stiffness.  Skin: Negative  for wound.  Neurological: Positive for headaches.  Hematological: Does not bruise/bleed easily.  Psychiatric/Behavioral: Negative for confusion.      Allergies  Review of patient's allergies indicates no known allergies.  Home Medications   Prior to Admission medications   Medication Sig Start Date End Date Taking? Authorizing Provider  amLODipine (NORVASC) 10 MG tablet Take 10 mg by mouth daily.   Yes Historical Provider, MD  amoxicillin-clavulanate (AUGMENTIN) 875-125 MG tablet Take 1 tablet by mouth 2 (two) times daily. Started 08/11/15 08/11/15  Yes Historical Provider, MD  atorvastatin (LIPITOR) 40 MG tablet Take 40 mg by mouth daily. 07/12/15  Yes Historical Provider, MD  beclomethasone (QVAR) 80 MCG/ACT inhaler Inhale 1 puff into the lungs daily. 10/22/13  Yes Historical Provider, MD  omeprazole (PRILOSEC) 10 MG capsule Take 10 mg by mouth daily.   Yes Historical Provider, MD  traZODone (DESYREL) 150 MG tablet Take 150 mg by mouth at bedtime. 06/30/15  Yes Historical Provider, MD  doxycycline (VIBRAMYCIN) 100 MG capsule Take 1 capsule (100 mg total) by mouth 2 (two) times daily. 08/12/15   Fredia Sorrow, MD  HYDROcodone-acetaminophen (NORCO/VICODIN) 5-325 MG tablet Take 1-2 tablets by mouth every 6 (six) hours as needed. 08/12/15   Fredia Sorrow, MD   BP 149/103 mmHg  Pulse 76  Temp(Src) 98.3 F (36.8 C) (Oral)  Resp 20  Ht 5\' 2"  (1.575 m)  Wt 109.77 kg  BMI 44.25 kg/m2  SpO2 100% Physical Exam  Constitutional: She is oriented to person, place, and time. She appears well-developed and well-nourished.  No distress.  HENT:  Head: Normocephalic and atraumatic.  Mouth/Throat: Oropharynx is clear and moist.  Eyes: Conjunctivae and EOM are normal. Pupils are equal, round, and reactive to light.  Sclera clear no pain with movement of extraocular muscles. Right eye lateral aspect on the skin with a 5 mm skin cyst with induration and erythema spreading redness of redness to the lower  part of the right eye and eyelid. No involvement of the upper eyelid.  Neck: Normal range of motion. Neck supple.  Cardiovascular: Normal rate, regular rhythm and normal heart sounds.   No murmur heard. Pulmonary/Chest: Effort normal and breath sounds normal. No respiratory distress.  Abdominal: Soft. Bowel sounds are normal. There is no tenderness.  Musculoskeletal: Normal range of motion.  Neurological: She is alert and oriented to person, place, and time. No cranial nerve deficit. She exhibits normal muscle tone. Coordination normal.  Skin: Skin is warm. There is erythema.  Nursing note and vitals reviewed.   ED Course  Procedures (including critical care time) Labs Review Labs Reviewed - No data to display  Imaging Review No results found. I have personally reviewed and evaluated these images and lab results as part of my medical decision-making.   EKG Interpretation None      MDM   Final diagnoses:  Periorbital cellulitis of right eye   Clinically no evidence of orbital cellulitis. No pain with movement of the eye. Patient with a skin cyst to the lateral aspect of the right eye with induration and tenderness no purulent discharge. Swelling and redness to the lower lid part of the eye but not the upper eye. Patient was treated with Rocephin IM yesterday and started on Augmentin. Patient states no significant improvement was back to the urgent care today and they referred her in for further evaluation. Would recommend continuing the Augmentin have provided her with a prescription of doxycycline and in case Augmentin doesn't stay start to show signs of improvement by tomorrow patient can switch over to doxycycline.  Follow-up patient will return for any new or worse symptoms precautions provided. Patient nontoxic no acute distress. Patient also had her blood pressure medicines renewed yesterday.    Fredia Sorrow, MD 08/12/15 1556

## 2015-08-12 NOTE — ED Notes (Signed)
Pt was given Rocephin 1g IM and Augmentin BID yesterday.

## 2015-08-12 NOTE — Discharge Instructions (Signed)
As we discussed continued the antibiotic Augmentin. If not seeing signs of improvement by tomorrow then start the doxycycline. Probably after a couple days on the doxycycline you can stop the Augmentin. Return for any new or worse symptoms. Return for any significant pain with movement of the eyeball itself.

## 2015-09-16 IMAGING — CR DG CHEST 1V PORT
1 series · 1 of 1 positions shown · non-contrast
Comparison: 03/25/2014

CLINICAL DATA: Status post PICC line placement

EXAM:
PORTABLE CHEST - 1 VIEW

[ap]
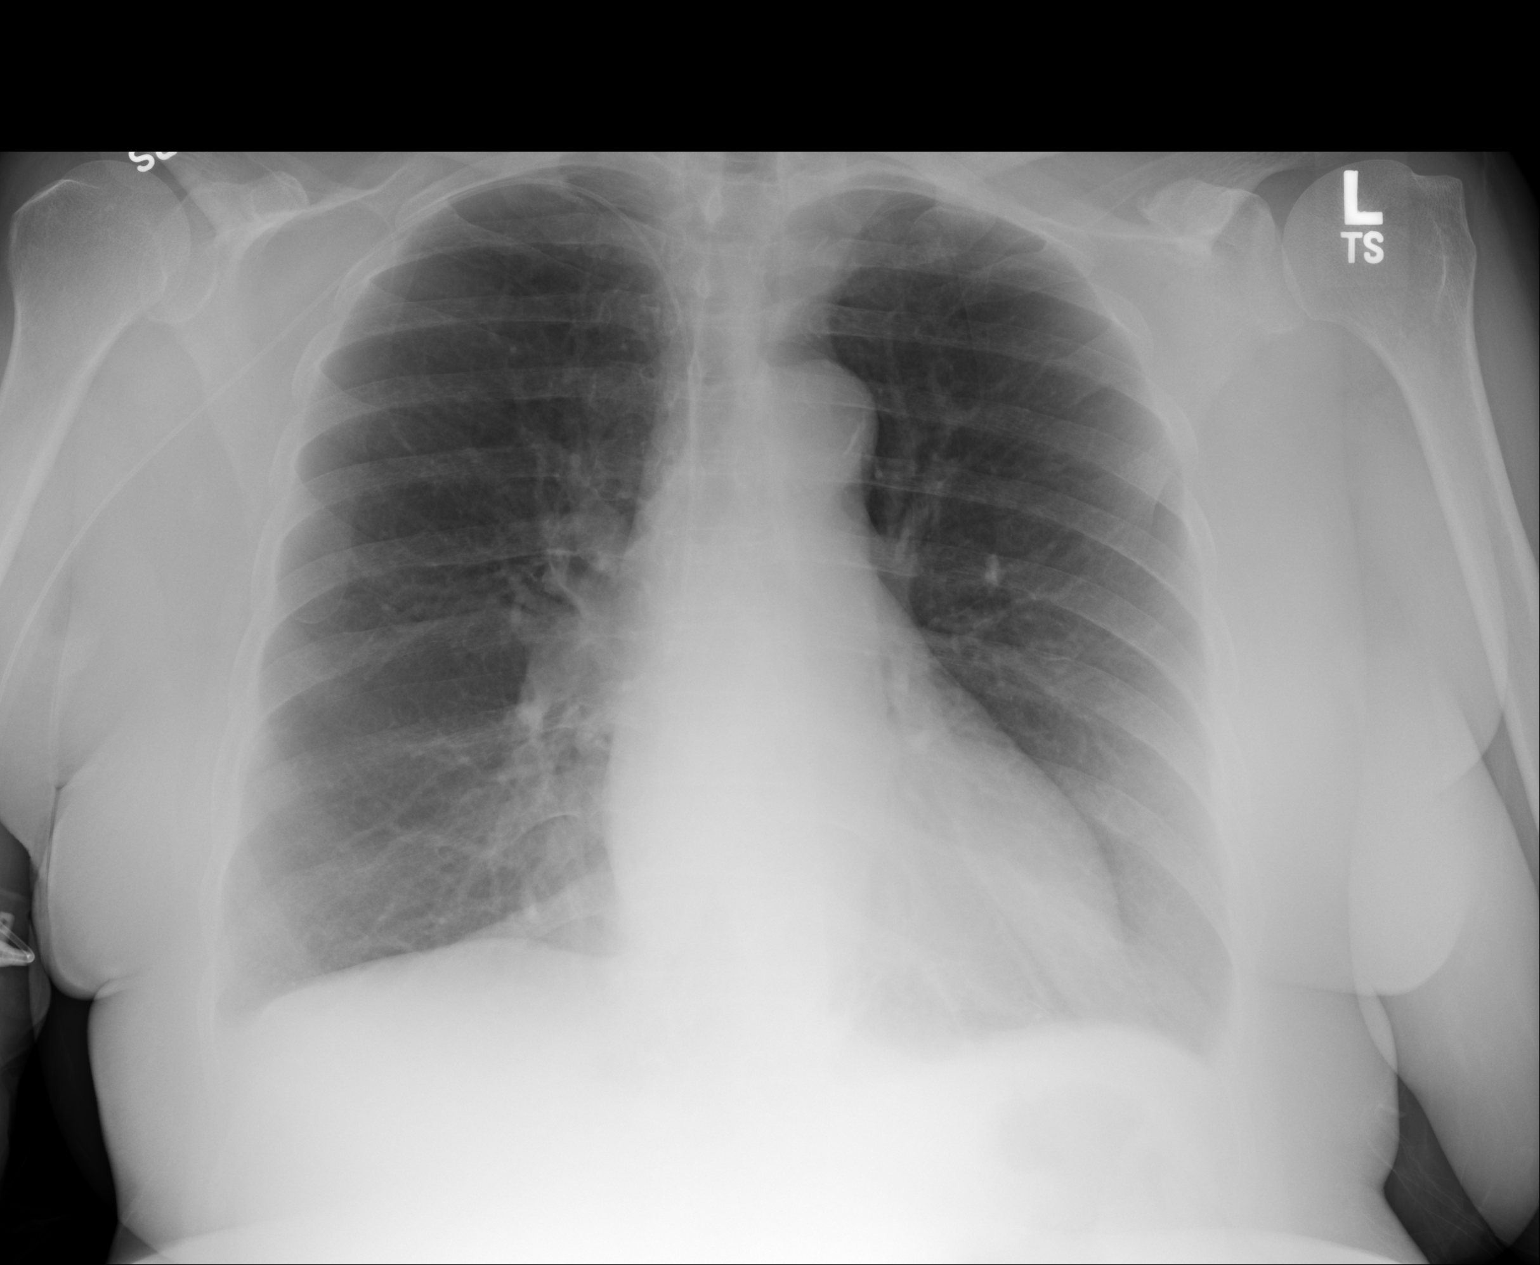

[1 of 1 positions shown; findings below may reference images not displayed]

FINDINGS: Cardiac shadow is within normal limits. The lungs are clear
bilaterally. A right-sided PICC line is seen. The catheter tip is
noted at the cavoatrial junction in satisfactory position.
IMPRESSION: Status post PICC line placement in satisfactory position. No acute
abnormality is noted.

## 2015-10-01 ENCOUNTER — Ambulatory Visit: Payer: Medicare HMO | Admitting: Anesthesiology

## 2015-10-01 ENCOUNTER — Encounter: Payer: Self-pay | Admitting: *Deleted

## 2015-10-01 ENCOUNTER — Ambulatory Visit
Admission: RE | Admit: 2015-10-01 | Discharge: 2015-10-01 | Disposition: A | Discharge status: Home / Self Care | Payer: Medicare HMO | Source: Ambulatory Visit | Attending: Gastroenterology | Admitting: Gastroenterology

## 2015-10-01 ENCOUNTER — Encounter
Admission: RE | Disposition: A | Discharge status: Home / Self Care | Payer: Self-pay | Source: Ambulatory Visit | Attending: Gastroenterology

## 2015-10-01 DIAGNOSIS — Z79899 Other long term (current) drug therapy: Secondary | ICD-10-CM | POA: Diagnosis not present

## 2015-10-01 DIAGNOSIS — J449 Chronic obstructive pulmonary disease, unspecified: Secondary | ICD-10-CM | POA: Insufficient documentation

## 2015-10-01 DIAGNOSIS — Z89619 Acquired absence of unspecified leg above knee: Secondary | ICD-10-CM | POA: Diagnosis not present

## 2015-10-01 DIAGNOSIS — Z8673 Personal history of transient ischemic attack (TIA), and cerebral infarction without residual deficits: Secondary | ICD-10-CM | POA: Insufficient documentation

## 2015-10-01 DIAGNOSIS — K219 Gastro-esophageal reflux disease without esophagitis: Secondary | ICD-10-CM | POA: Diagnosis not present

## 2015-10-01 DIAGNOSIS — K5730 Diverticulosis of large intestine without perforation or abscess without bleeding: Secondary | ICD-10-CM | POA: Diagnosis not present

## 2015-10-01 DIAGNOSIS — I739 Peripheral vascular disease, unspecified: Secondary | ICD-10-CM | POA: Insufficient documentation

## 2015-10-01 DIAGNOSIS — Z6841 Body Mass Index (BMI) 40.0 and over, adult: Secondary | ICD-10-CM | POA: Diagnosis not present

## 2015-10-01 DIAGNOSIS — Z9851 Tubal ligation status: Secondary | ICD-10-CM | POA: Insufficient documentation

## 2015-10-01 DIAGNOSIS — Z1211 Encounter for screening for malignant neoplasm of colon: Secondary | ICD-10-CM | POA: Insufficient documentation

## 2015-10-01 DIAGNOSIS — I10 Essential (primary) hypertension: Secondary | ICD-10-CM | POA: Insufficient documentation

## 2015-10-01 DIAGNOSIS — F17200 Nicotine dependence, unspecified, uncomplicated: Secondary | ICD-10-CM | POA: Diagnosis not present

## 2015-10-01 DIAGNOSIS — D127 Benign neoplasm of rectosigmoid junction: Secondary | ICD-10-CM | POA: Diagnosis not present

## 2015-10-01 DIAGNOSIS — E7800 Pure hypercholesterolemia, unspecified: Secondary | ICD-10-CM | POA: Diagnosis not present

## 2015-10-01 DIAGNOSIS — E785 Hyperlipidemia, unspecified: Secondary | ICD-10-CM | POA: Diagnosis not present

## 2015-10-01 HISTORY — PX: COLONOSCOPY WITH PROPOFOL: SHX5780

## 2015-10-01 SURGERY — COLONOSCOPY WITH PROPOFOL
Anesthesia: General

## 2015-10-01 MED ORDER — SODIUM CHLORIDE 0.9 % IV SOLN
INTRAVENOUS | Status: DC
  Administered 2015-10-01: 16:00:00 via INTRAVENOUS

## 2015-10-01 MED ORDER — MIDAZOLAM HCL 2 MG/2ML IJ SOLN
INTRAMUSCULAR | Status: DC | PRN
  Administered 2015-10-01: 1 mg via INTRAVENOUS

## 2015-10-01 MED ORDER — SODIUM CHLORIDE 0.9 % IV SOLN
INTRAVENOUS | Status: DC
  Administered 2015-10-01: 1000 mL via INTRAVENOUS

## 2015-10-01 MED ORDER — LIDOCAINE HCL (CARDIAC) 20 MG/ML IV SOLN
INTRAVENOUS | Status: DC | PRN
  Administered 2015-10-01: 60 mg via INTRAVENOUS

## 2015-10-01 MED ORDER — PROPOFOL 10 MG/ML IV BOLUS
INTRAVENOUS | Status: DC | PRN
  Administered 2015-10-01: 20 mg via INTRAVENOUS
  Administered 2015-10-01: 60 mg via INTRAVENOUS

## 2015-10-01 MED ORDER — PROPOFOL 500 MG/50ML IV EMUL
INTRAVENOUS | Status: DC | PRN
  Administered 2015-10-01: 120 ug/kg/min via INTRAVENOUS

## 2015-10-01 NOTE — H&P (Signed)
Outpatient short stay form Pre-procedure 10/01/2015 3:11 PM Natalie Sails MD  Primary Physician: [facility] health Department  Reason for visit:  Colonoscopy  History of present illness:  Patient is a 60 year old female presenting today for colonoscopy. She has a personal history of adenomatous colon polyps. She tolerated her prep well. She has discontinued her aspirin last week. She takes no other aspirin products or blood thinning agents.  Patient has a history of a vascular procedure in the left foot with infection and green. This was in October 2015. There was a left AKA amputation.    Current facility-administered medications:  .  0.9 %  sodium chloride infusion, , Intravenous, Continuous, Natalie Sails, MD, Last Rate: 20 mL/hr at 10/01/15 1351, 1,000 mL at 10/01/15 1351 .  0.9 %  sodium chloride infusion, , Intravenous, Continuous, Natalie Sails, MD  Prescriptions prior to admission  Medication Sig Dispense Refill Last Dose  . atorvastatin (LIPITOR) 40 MG tablet Take 40 mg by mouth daily.   09/30/2015 at Unknown time  . beclomethasone (QVAR) 80 MCG/ACT inhaler Inhale 1 puff into the lungs daily.   09/30/2015 at Unknown time  . HYDROcodone-acetaminophen (NORCO/VICODIN) 5-325 MG tablet Take 1-2 tablets by mouth every 6 (six) hours as needed. 10 tablet 0 Past Week at Unknown time  . omeprazole (PRILOSEC) 10 MG capsule Take 10 mg by mouth daily.   09/30/2015 at Unknown time  . amLODipine (NORVASC) 10 MG tablet Take 10 mg by mouth daily. Reported on 10/01/2015   Not Taking  . amoxicillin-clavulanate (AUGMENTIN) 875-125 MG tablet Take 1 tablet by mouth 2 (two) times daily. Reported on 10/01/2015   Not Taking at Unknown time  . doxycycline (VIBRAMYCIN) 100 MG capsule Take 1 capsule (100 mg total) by mouth 2 (two) times daily. (Patient not taking: Reported on 10/01/2015) 14 capsule 0 Not Taking at Unknown time  . traZODone (DESYREL) 150 MG tablet Take 150 mg by mouth at bedtime.  Reported on 10/01/2015   Not Taking at Unknown time     No Known Allergies   Past Medical History  Diagnosis Date  . Hypertension   . Hypercholesterolemia     Review of systems:      Physical Exam    Heart and lungs: Regular rate and rhythm without rub or murmur or gallop. Lungs are bilaterally clear    HEENT: Normocephalic atraumatic eyes are anicteric    Other:     Pertinant exam for procedure: Soft nontender nondistended bowel sounds positive normoactive.    Planned proceedures: Colonoscopy and indicated procedures. I have discussed the risks benefits and complications of procedures to include not limited to bleeding, infection, perforation and the risk of sedation and the patient wishes to proceed.    Natalie Sails, MD Gastroenterology 10/01/2015  3:11 PM

## 2015-10-01 NOTE — Anesthesia Preprocedure Evaluation (Addendum)
Anesthesia Evaluation  Patient identified by MRN, date of birth, ID band Patient awake    Reviewed: Allergy & Precautions, H&P , NPO status , Patient's Chart, lab work & pertinent test results  History of Anesthesia Complications Negative for: history of anesthetic complications  Airway Mallampati: III  TM Distance: >3 FB Neck ROM: full    Dental  (+) Poor Dentition, Upper Dentures, Lower Dentures, Missing   Pulmonary neg shortness of breath, Current Smoker,    Pulmonary exam normal breath sounds clear to auscultation       Cardiovascular Exercise Tolerance: Good hypertension, (-) angina(-) Past MI and (-) DOE Normal cardiovascular exam Rhythm:regular Rate:Normal     Neuro/Psych negative neurological ROS  negative psych ROS   GI/Hepatic negative GI ROS, Neg liver ROS, neg GERD  ,  Endo/Other  Morbid obesity  Renal/GU negative Renal ROS  negative genitourinary   Musculoskeletal negative musculoskeletal ROS (+)   Abdominal   Peds negative pediatric ROS (+)  Hematology negative hematology ROS (+)   Anesthesia Other Findings Past Medical History:   Hypertension                                                 Hypercholesterolemia                                        Past Surgical History:   above the knee amputation                                    BMI    Body Mass Index   43.52 kg/m 2      Reproductive/Obstetrics negative OB ROS                            Anesthesia Physical Anesthesia Plan  ASA: III  Anesthesia Plan: General   Post-op Pain Management:    Induction:   Airway Management Planned:   Additional Equipment:   Intra-op Plan:   Post-operative Plan:   Informed Consent: I have reviewed the patients History and Physical, chart, labs and discussed the procedure including the risks, benefits and alternatives for the proposed anesthesia with the patient or  authorized representative who has indicated his/her understanding and acceptance.   Dental Advisory Given  Plan Discussed with: Anesthesiologist, CRNA and Surgeon  Anesthesia Plan Comments:         Anesthesia Quick Evaluation

## 2015-10-01 NOTE — Op Note (Signed)
[facility] Patient Name: Verlean Navidad Procedure Date: 10/01/2015 3:44 PM MRN: [id number] Account #: [id number] Date of Birth: 1955/08/25 Admit Type: Outpatient Age: 60 Room: [facility] ENDO ROOM 3 Gender: Female Note Status: Finalized Procedure:            Colonoscopy Indications:          Personal history of colonic polyps Providers:            [name], MD Medicines:            Monitored Anesthesia Care Complications:        No immediate complications. Procedure:            Pre-Anesthesia Assessment:                       - ASA Grade Assessment: III - A patient with severe                        systemic disease.                       After obtaining informed consent, the colonoscope was                        passed under direct vision. Throughout the procedure,                        the patient's blood pressure, pulse, and oxygen                        saturations were monitored continuously. The                        Colonoscope was introduced through the anus and                        advanced to the the cecum, identified by appendiceal                        orifice and ileocecal valve. The colonoscopy was                        unusually difficult due to poor bowel prep. Successful                        completion of the procedure was aided by lavage. Findings:      Two sessile polyps were found in the recto-sigmoid colon. The polyps       were 2 to 3 mm in size. These polyps were removed with a cold biopsy       forceps. Resection and retrieval were complete.      A few small-mouthed diverticula were found in the sigmoid colon and       descending colon.      The digital rectal exam was normal.      - please note poor prep with much particulate matter that precluded       adequate rinsing. Impression:           - Two 2 to 3 mm polyps at the recto-sigmoid colon,  removed with a cold biopsy forceps.  Resected and                        retrieved.                       - Diverticulosis in the sigmoid colon and in the                        descending colon. Recommendation:       - Discharge patient to home.                       - Await pathology results.                       - Telephone GI clinic for pathology results in 1 week.                       - Repeat colonoscopy in 2 years for surveillance,                        personal h/o colon polyp[s and poor prep today. Procedure Code(s):    --- Professional ---                       [phone], Colonoscopy, flexible; with biopsy, single or                        multiple Diagnosis Code(s):    --- Professional ---                       D12.7, Benign neoplasm of rectosigmoid junction                       Z86.010, Personal history of colonic polyps                       K57.30, Diverticulosis of large intestine without                        perforation or abscess without bleeding CPT copyright 2016 American Medical Association. All rights reserved. The codes documented in this report are preliminary and upon coder review may  be revised to meet current compliance requirements. [name], MD 10/01/2015 4:22:18 PM This report has been signed electronically. Number of Addenda: 0 Note Initiated On: 10/01/2015 3:44 PM Scope Withdrawal Time: 0 hours 8 minutes 57 seconds  Total Procedure Duration: 0 hours 25 minutes 32 seconds       [facility]

## 2015-10-01 NOTE — Transfer of Care (Signed)
Immediate Anesthesia Transfer of Care Note  Patient: Natalie [name]  Procedure(s) Performed: Procedure(s): COLONOSCOPY WITH PROPOFOL (N/A)  Patient Location: PACU  Anesthesia Type:General  Level of Consciousness: awake and patient cooperative  Airway & Oxygen Therapy: Patient Spontanous Breathing  Post-op Assessment: Report given to RN  Post vital signs: Reviewed and stable  Last Vitals:  Filed Vitals:   10/01/15 1330 10/01/15 1622  BP: 167/93 130/82  Pulse: 70 77  Temp: 36.6 C 35.9 C  Resp: 20 19    Complications: No apparent anesthesia complications

## 2015-10-05 NOTE — Anesthesia Postprocedure Evaluation (Signed)
Anesthesia Post Note  Patient: Natalie [name]  Procedure(s) Performed: Procedure(s) (LRB): COLONOSCOPY WITH PROPOFOL (N/A)  Patient location during evaluation: Endoscopy Anesthesia Type: General Level of consciousness: awake and alert Pain management: pain level controlled Vital Signs Assessment: post-procedure vital signs reviewed and stable Respiratory status: spontaneous breathing, nonlabored ventilation, respiratory function stable and patient connected to nasal cannula oxygen Cardiovascular status: blood pressure returned to baseline and stable Postop Assessment: no signs of nausea or vomiting Anesthetic complications: no    Last Vitals:  Filed Vitals:   10/01/15 1642 10/01/15 1652  BP: 154/91 172/97  Pulse: 67 69  Temp:    Resp: 19 18    Last Pain: There were no vitals filed for this visit.               Martha Clan

## 2015-10-06 ENCOUNTER — Encounter: Payer: Self-pay | Admitting: Gastroenterology

## 2015-10-06 LAB — SURGICAL PATHOLOGY

## 2021-04-13 ENCOUNTER — Encounter: Payer: Self-pay | Admitting: Internal Medicine

## 2021-04-19 DIAGNOSIS — N183 Chronic kidney disease, stage 3 unspecified: Secondary | ICD-10-CM | POA: Insufficient documentation

## 2021-04-19 DIAGNOSIS — I10 Essential (primary) hypertension: Secondary | ICD-10-CM | POA: Insufficient documentation

## 2021-04-19 DIAGNOSIS — Z89619 Acquired absence of unspecified leg above knee: Secondary | ICD-10-CM | POA: Insufficient documentation

## 2021-04-19 DIAGNOSIS — K219 Gastro-esophageal reflux disease without esophagitis: Secondary | ICD-10-CM | POA: Insufficient documentation

## 2021-04-19 DIAGNOSIS — H5210 Myopia, unspecified eye: Secondary | ICD-10-CM | POA: Insufficient documentation

## 2021-04-19 DIAGNOSIS — I129 Hypertensive chronic kidney disease with stage 1 through stage 4 chronic kidney disease, or unspecified chronic kidney disease: Secondary | ICD-10-CM | POA: Insufficient documentation

## 2021-04-19 DIAGNOSIS — E785 Hyperlipidemia, unspecified: Secondary | ICD-10-CM | POA: Insufficient documentation

## 2021-04-21 ENCOUNTER — Other Ambulatory Visit (HOSPITAL_COMMUNITY): Payer: Self-pay | Admitting: Nephrology

## 2021-04-21 DIAGNOSIS — R809 Proteinuria, unspecified: Secondary | ICD-10-CM

## 2021-04-21 DIAGNOSIS — Z716 Tobacco abuse counseling: Secondary | ICD-10-CM

## 2021-04-21 DIAGNOSIS — I129 Hypertensive chronic kidney disease with stage 1 through stage 4 chronic kidney disease, or unspecified chronic kidney disease: Secondary | ICD-10-CM

## 2021-04-21 DIAGNOSIS — N184 Chronic kidney disease, stage 4 (severe): Secondary | ICD-10-CM

## 2021-04-21 DIAGNOSIS — N281 Cyst of kidney, acquired: Secondary | ICD-10-CM

## 2021-05-26 ENCOUNTER — Ambulatory Visit (HOSPITAL_COMMUNITY): Admission: RE | Admit: 2021-05-26 | Payer: Medicare HMO | Source: Ambulatory Visit

## 2021-05-26 ENCOUNTER — Other Ambulatory Visit: Payer: Self-pay

## 2021-05-26 ENCOUNTER — Encounter (HOSPITAL_COMMUNITY): Payer: Self-pay

## 2021-05-26 ENCOUNTER — Ambulatory Visit (HOSPITAL_COMMUNITY): Payer: Medicare HMO

## 2021-05-26 ENCOUNTER — Other Ambulatory Visit (HOSPITAL_COMMUNITY)
Admission: RE | Admit: 2021-05-26 | Discharge: 2021-05-26 | Disposition: A | Discharge status: Home / Self Care | Payer: Medicare HMO | Source: Ambulatory Visit | Attending: Nephrology | Admitting: Nephrology

## 2021-05-26 DIAGNOSIS — N281 Cyst of kidney, acquired: Secondary | ICD-10-CM | POA: Diagnosis present

## 2021-05-26 DIAGNOSIS — Z716 Tobacco abuse counseling: Secondary | ICD-10-CM | POA: Diagnosis present

## 2021-05-26 DIAGNOSIS — N184 Chronic kidney disease, stage 4 (severe): Secondary | ICD-10-CM | POA: Insufficient documentation

## 2021-05-26 DIAGNOSIS — I129 Hypertensive chronic kidney disease with stage 1 through stage 4 chronic kidney disease, or unspecified chronic kidney disease: Secondary | ICD-10-CM | POA: Diagnosis present

## 2021-05-26 DIAGNOSIS — R809 Proteinuria, unspecified: Secondary | ICD-10-CM | POA: Diagnosis present

## 2021-05-26 LAB — RENAL FUNCTION PANEL
Albumin: 4 g/dL (ref 3.5–5.0)
Anion gap: 5 (ref 5–15)
BUN: 20 mg/dL (ref 8–23)
CO2: 30 mmol/L (ref 22–32)
Calcium: 9.6 mg/dL (ref 8.9–10.3)
Chloride: 104 mmol/L (ref 98–111)
Creatinine, Ser: 1.9 mg/dL — ABNORMAL HIGH (ref 0.44–1.00)
GFR, Estimated: 29 mL/min — ABNORMAL LOW (ref >60)
Glucose, Bld: 98 mg/dL (ref 70–99)
Phosphorus: 2.5 mg/dL (ref 2.5–4.6)
Potassium: 4.3 mmol/L (ref 3.5–5.1)
Sodium: 139 mmol/L (ref 135–145)

## 2021-05-31 ENCOUNTER — Encounter: Payer: Self-pay | Admitting: *Deleted

## 2021-05-31 ENCOUNTER — Other Ambulatory Visit: Payer: Self-pay

## 2021-05-31 ENCOUNTER — Ambulatory Visit (INDEPENDENT_AMBULATORY_CARE_PROVIDER_SITE_OTHER): Payer: Self-pay | Admitting: *Deleted

## 2021-05-31 ENCOUNTER — Encounter (INDEPENDENT_AMBULATORY_CARE_PROVIDER_SITE_OTHER): Payer: Self-pay

## 2021-05-31 VITALS — Ht 62.5 in | Wt 229.2 lb

## 2021-05-31 DIAGNOSIS — Z86010 Personal history of colonic polyps: Secondary | ICD-10-CM

## 2021-05-31 MED ORDER — PEG 3350-KCL-NA BICARB-NACL 420 G PO SOLR: 4000.0000 mL | Freq: Once | ORAL | 0 refills | Status: AC

## 2021-05-31 NOTE — Progress Notes (Addendum)
Gastroenterology Pre-Procedure Review  Request Date: 05/31/2021 Requesting Physician: Mallie Snooks, FNP @ [facility], Last TCS done 10/01/2015 by Dr. Gustavo Lah, 2 sessile polyps  PATIENT REVIEW QUESTIONS: The patient responded to the following health history questions as indicated:    1. Diabetes Melitis: no 2. Joint replacements in the past 12 months: no 3. Major health problems in the past 3 months: no 4. Has an artificial valve or MVP: no 5. Has a defibrillator: no 6. Has been advised in past to take antibiotics in advance of a procedure like teeth cleaning: no 7. Family history of colon cancer: no  8. Alcohol Use: no 9. Illicit drug Use: no 10. History of sleep apnea: no  11. History of coronary artery or other vascular stents placed within the last 12 months: no 12. History of any prior anesthesia complications: no 13. Body mass index is 41.25 kg/m.    MEDICATIONS & ALLERGIES:    Patient reports the following regarding taking any blood thinners:   Plavix? no Aspirin? Yes, 81 mg Coumadin? no Brilinta? no Xarelto? no Eliquis? no Pradaxa? no Savaysa? no Effient? no  Patient confirms/reports the following medications:  Current Outpatient Medications  Medication Sig Dispense Refill   amLODipine (NORVASC) 10 MG tablet Take 10 mg by mouth daily. Reported on 10/01/2015     aspirin 81 MG chewable tablet Chew by mouth daily at 6 (six) AM.     atorvastatin (LIPITOR) 40 MG tablet Take 40 mg by mouth daily.     hydrALAZINE (APRESOLINE) 25 MG tablet Take by mouth in the morning, at noon, and at bedtime.     losartan (COZAAR) 25 MG tablet Take by mouth daily at 6 (six) AM.     omeprazole (PRILOSEC) 10 MG capsule Take 10 mg by mouth daily.     traZODone (DESYREL) 150 MG tablet Take 150 mg by mouth at bedtime. Reported on 10/01/2015     No current facility-administered medications for this visit.    Patient confirms/reports the following allergies:  No Known  Allergies  No orders of the defined types were placed in this encounter.   [place] INFORMATION Primary Insurance: [employer],  [place] #: [id number],  Group #: [id number] Pre-Cert / Auth required: Yes, approved online 63/03/4535-46/03/320 Pre-Cert / Josem Kaufmann #: [id number]  SCHEDULE INFORMATION: Procedure has been scheduled as follows:  Date: 06/13/2021, Time: 10:00  Location: [facility] with Dr. Abbey Chatters  This Gastroenterology Pre-Precedure Review Form is being routed to the following provider(s): Roseanne Kaufman, NP

## 2021-05-31 NOTE — Progress Notes (Signed)
Appropriate. ASA 3 due to BMI.

## 2021-06-01 ENCOUNTER — Other Ambulatory Visit: Payer: Self-pay | Admitting: *Deleted

## 2021-06-01 ENCOUNTER — Encounter: Payer: Self-pay | Admitting: *Deleted

## 2021-06-01 NOTE — Progress Notes (Signed)
Mailed Pre-op letter to pt.  LMOM for pt to call me back so I can inform her of Pre-op information.

## 2021-06-01 NOTE — Progress Notes (Signed)
Spoke to pt.  She was made aware of Pre-op appt.

## 2021-06-08 NOTE — Patient Instructions (Signed)
Natalie [name]  06/08/2021     @PREFPERIOPPHARMACY @   Your procedure is scheduled on  06/13/2021.   Report to [facility] at  [place].M.   Call this number if you have problems the morning of surgery:  [phone]   Remember:  Follow the diet and prep instructions given to you by the office.    Take these medicines the morning of surgery with A SIP OF WATER                   amlodipine, omeprazole.     Do not wear jewelry, make-up or nail polish.  Do not wear lotions, powders, or perfumes, or deodorant.  Do not shave 48 hours prior to surgery.  Men may shave face and neck.  Do not bring valuables to the hospital.  [facility] is not responsible for any belongings or valuables.  Contacts, dentures or bridgework may not be worn into surgery.  Leave your suitcase in the car.  After surgery it may be brought to your room.  For patients admitted to the hospital, discharge time will be determined by your treatment team.  Patients discharged the day of surgery will not be allowed to drive home and must have someone with them for 24 hours.    Special instructions:   DO NOT smoke tobacco or vape for 24 hours before your procedure.  Please read over the following fact sheets that you were given. Anesthesia Post-op Instructions and Care and Recovery After Surgery      Colonoscopy, Adult, Care After This sheet gives you information about how to care for yourself after your procedure. Your health care provider may also give you more specific instructions. If you have problems or questions, contact your health care provider. What can I expect after the procedure? After the procedure, it is common to have: A small amount of blood in your stool for 24 hours after the procedure. Some gas. Mild cramping or bloating of your abdomen. Follow these instructions at home: Eating and drinking  Drink enough fluid to keep your urine pale yellow. Follow instructions from your health  care provider about eating or drinking restrictions. Resume your normal diet as instructed by your health care provider. Avoid heavy or fried foods that are hard to digest. Activity Rest as told by your health care provider. Avoid sitting for a long time without moving. Get up to take short walks every 1-2 hours. This is important to improve blood flow and breathing. Ask for help if you feel weak or unsteady. Return to your normal activities as told by your health care provider. Ask your health care provider what activities are safe for you. Managing cramping and bloating  Try walking around when you have cramps or feel bloated. Apply heat to your abdomen as told by your health care provider. Use the heat source that your health care provider recommends, such as a moist heat pack or a heating pad. Place a towel between your skin and the heat source. Leave the heat on for 20-30 minutes. Remove the heat if your skin turns bright red. This is especially important if you are unable to feel pain, heat, or cold. You may have a greater risk of getting burned. General instructions If you were given a sedative during the procedure, it can affect you for several hours. Do not drive or operate machinery until your health care provider says that it is safe. For the first 24  hours after the procedure: Do not sign important documents. Do not drink alcohol. Do your regular daily activities at a slower pace than normal. Eat soft foods that are easy to digest. Take over-the-counter and prescription medicines only as told by your health care provider. Keep all follow-up visits as told by your health care provider. This is important. Contact a health care provider if: You have blood in your stool 2-3 days after the procedure. Get help right away if you have: More than a small spotting of blood in your stool. Large blood clots in your stool. Swelling of your abdomen. Nausea or vomiting. A fever. Increasing  pain in your abdomen that is not relieved with medicine. Summary After the procedure, it is common to have a small amount of blood in your stool. You may also have mild cramping and bloating of your abdomen. If you were given a sedative during the procedure, it can affect you for several hours. Do not drive or operate machinery until your health care provider says that it is safe. Get help right away if you have a lot of blood in your stool, nausea or vomiting, a fever, or increased pain in your abdomen. This information is not intended to replace advice given to you by your health care provider. Make sure you discuss any questions you have with your health care provider. Document Revised: 07/18/2019 Document Reviewed: 02/17/2019 Elsevier Patient Education  [place] After This sheet gives you information about how to care for yourself after your procedure. Your health care provider may also give you more specific instructions. If you have problems or questions, contact your health care provider. What can I expect after the procedure? After the procedure, it is common to have: Tiredness. Forgetfulness about what happened after the procedure. Impaired judgment for important decisions. Nausea or vomiting. Some difficulty with balance. Follow these instructions at home: For the time period you were told by your health care provider:   Rest as needed. Do not participate in activities where you could fall or become injured. Do not drive or use machinery. Do not drink alcohol. Do not take sleeping pills or medicines that cause drowsiness. Do not make important decisions or sign legal documents. Do not take care of children on your own. Eating and drinking Follow the diet that is recommended by your health care provider. Drink enough fluid to keep your urine pale yellow. If you vomit: Drink water, juice, or soup when you can drink without  vomiting. Make sure you have little or no nausea before eating solid foods. General instructions Have a responsible adult stay with you for the time you are told. It is important to have someone help care for you until you are awake and alert. Take over-the-counter and prescription medicines only as told by your health care provider. If you have sleep apnea, surgery and certain medicines can increase your risk for breathing problems. Follow instructions from your health care provider about wearing your sleep device: Anytime you are sleeping, including during daytime naps. While taking prescription pain medicines, sleeping medicines, or medicines that make you drowsy. Avoid smoking. Keep all follow-up visits as told by your health care provider. This is important. Contact a health care provider if: You keep feeling nauseous or you keep vomiting. You feel light-headed. You are still sleepy or having trouble with balance after 24 hours. You develop a rash. You have a fever. You have redness or swelling around the IV site.  Get help right away if: You have trouble breathing. You have new-onset confusion at home. Summary For several hours after your procedure, you may feel tired. You may also be forgetful and have poor judgment. Have a responsible adult stay with you for the time you are told. It is important to have someone help care for you until you are awake and alert. Rest as told. Do not drive or operate machinery. Do not drink alcohol or take sleeping pills. Get help right away if you have trouble breathing, or if you suddenly become confused. This information is not intended to replace advice given to you by your health care provider. Make sure you discuss any questions you have with your health care provider. Document Revised: 04/08/2020 Document Reviewed: 06/26/2019 Elsevier Patient Education  2022 [employer].

## 2021-06-09 ENCOUNTER — Other Ambulatory Visit: Payer: Self-pay

## 2021-06-09 ENCOUNTER — Encounter (HOSPITAL_COMMUNITY)
Admission: RE | Admit: 2021-06-09 | Discharge: 2021-06-09 | Disposition: A | Discharge status: Home / Self Care | Payer: Medicare HMO | Source: Ambulatory Visit | Attending: Internal Medicine | Admitting: Internal Medicine

## 2021-06-09 ENCOUNTER — Encounter (HOSPITAL_COMMUNITY): Payer: Self-pay

## 2021-06-09 DIAGNOSIS — Z01810 Encounter for preprocedural cardiovascular examination: Secondary | ICD-10-CM | POA: Diagnosis not present

## 2021-06-09 HISTORY — DX: Cellulitis, unspecified: L03.90

## 2021-06-13 ENCOUNTER — Ambulatory Visit (HOSPITAL_COMMUNITY): Payer: Medicare HMO | Admitting: Anesthesiology

## 2021-06-13 ENCOUNTER — Encounter (HOSPITAL_COMMUNITY): Payer: Self-pay

## 2021-06-13 ENCOUNTER — Ambulatory Visit (HOSPITAL_COMMUNITY)
Admission: RE | Admit: 2021-06-13 | Discharge: 2021-06-13 | Disposition: A | Discharge status: Home / Self Care | Payer: Medicare HMO | Attending: Internal Medicine | Admitting: Internal Medicine

## 2021-06-13 ENCOUNTER — Encounter (HOSPITAL_COMMUNITY)
Admission: RE | Disposition: A | Discharge status: Home / Self Care | Payer: Self-pay | Source: Home / Self Care | Attending: Internal Medicine

## 2021-06-13 DIAGNOSIS — Z89612 Acquired absence of left leg above knee: Secondary | ICD-10-CM | POA: Diagnosis not present

## 2021-06-13 DIAGNOSIS — K648 Other hemorrhoids: Secondary | ICD-10-CM | POA: Insufficient documentation

## 2021-06-13 DIAGNOSIS — I10 Essential (primary) hypertension: Secondary | ICD-10-CM | POA: Diagnosis not present

## 2021-06-13 DIAGNOSIS — K5730 Diverticulosis of large intestine without perforation or abscess without bleeding: Secondary | ICD-10-CM | POA: Diagnosis not present

## 2021-06-13 DIAGNOSIS — K635 Polyp of colon: Secondary | ICD-10-CM | POA: Diagnosis not present

## 2021-06-13 DIAGNOSIS — F17210 Nicotine dependence, cigarettes, uncomplicated: Secondary | ICD-10-CM | POA: Insufficient documentation

## 2021-06-13 DIAGNOSIS — Z86010 Personal history of colonic polyps: Secondary | ICD-10-CM | POA: Insufficient documentation

## 2021-06-13 DIAGNOSIS — D124 Benign neoplasm of descending colon: Secondary | ICD-10-CM | POA: Diagnosis not present

## 2021-06-13 DIAGNOSIS — J449 Chronic obstructive pulmonary disease, unspecified: Secondary | ICD-10-CM | POA: Insufficient documentation

## 2021-06-13 DIAGNOSIS — Z1211 Encounter for screening for malignant neoplasm of colon: Secondary | ICD-10-CM | POA: Insufficient documentation

## 2021-06-13 DIAGNOSIS — D125 Benign neoplasm of sigmoid colon: Secondary | ICD-10-CM

## 2021-06-13 DIAGNOSIS — K219 Gastro-esophageal reflux disease without esophagitis: Secondary | ICD-10-CM | POA: Diagnosis not present

## 2021-06-13 HISTORY — PX: POLYPECTOMY: SHX5525

## 2021-06-13 HISTORY — PX: COLONOSCOPY WITH PROPOFOL: SHX5780

## 2021-06-13 SURGERY — COLONOSCOPY WITH PROPOFOL
Anesthesia: General

## 2021-06-13 MED ORDER — PROPOFOL 500 MG/50ML IV EMUL
INTRAVENOUS | Status: DC | PRN
  Administered 2021-06-13: 150 ug/kg/min via INTRAVENOUS

## 2021-06-13 MED ORDER — PROPOFOL 10 MG/ML IV BOLUS
INTRAVENOUS | Status: DC | PRN
  Administered 2021-06-13: 100 mg via INTRAVENOUS

## 2021-06-13 MED ORDER — PROPOFOL 500 MG/50ML IV EMUL
INTRAVENOUS | Status: AC
  Filled 2021-06-13: qty 50

## 2021-06-13 MED ORDER — LACTATED RINGERS IV SOLN
INTRAVENOUS | Status: DC
  Administered 2021-06-13: 1000 mL via INTRAVENOUS

## 2021-06-13 NOTE — Discharge Instructions (Signed)
  Colonoscopy Discharge Instructions  Read the instructions outlined below and refer to this sheet in the next few weeks. These discharge instructions provide you with general information on caring for yourself after you leave the hospital. Your doctor may also give you specific instructions. While your treatment has been planned according to the most current medical practices available, unavoidable complications occasionally occur.   ACTIVITY You may resume your regular activity, but move at a slower pace for the next 24 hours.  Take frequent rest periods for the next 24 hours.  Walking will help get rid of the air and reduce the bloated feeling in your belly (abdomen).  No driving for 24 hours (because of the medicine (anesthesia) used during the test).   Do not sign any important legal documents or operate any machinery for 24 hours (because of the anesthesia used during the test).  NUTRITION Drink plenty of fluids.  You may resume your normal diet as instructed by your doctor.  Begin with a light meal and progress to your normal diet. Heavy or fried foods are harder to digest and may make you feel sick to your stomach (nauseated).  Avoid alcoholic beverages for 24 hours or as instructed.  MEDICATIONS You may resume your normal medications unless your doctor tells you otherwise.  WHAT YOU CAN EXPECT TODAY Some feelings of bloating in the abdomen.  Passage of more gas than usual.  Spotting of blood in your stool or on the toilet paper.  IF YOU HAD POLYPS REMOVED DURING THE COLONOSCOPY: No aspirin products for 7 days or as instructed.  No alcohol for 7 days or as instructed.  Eat a soft diet for the next 24 hours.  FINDING OUT THE RESULTS OF YOUR TEST Not all test results are available during your visit. If your test results are not back during the visit, make an appointment with your caregiver to find out the results. Do not assume everything is normal if you have not heard from your  caregiver or the medical facility. It is important for you to follow up on all of your test results.  SEEK IMMEDIATE MEDICAL ATTENTION IF: You have more than a spotting of blood in your stool.  Your belly is swollen (abdominal distention).  You are nauseated or vomiting.  You have a temperature over 101.  You have abdominal pain or discomfort that is severe or gets worse throughout the day.   Your colonoscopy revealed 5 polyp(s) which I removed successfully. Await pathology results, my office will contact you. I recommend repeating colonoscopy in 3 years for surveillance purposes. Otherwise follow up with GI as needed.    I hope you have a great rest of your week!  Elon Alas. Abbey Chatters, D.O. Gastroenterology and Hepatology [facility]

## 2021-06-13 NOTE — Anesthesia Preprocedure Evaluation (Signed)
Anesthesia Evaluation  Patient identified by MRN, date of birth, ID band Patient awake    Reviewed: Allergy & Precautions, NPO status , Patient's Chart, lab work & pertinent test results  Airway Mallampati: II  TM Distance: >3 FB Neck ROM: Full    Dental  (+) Edentulous Upper, Edentulous Lower   Pulmonary COPD,  COPD inhaler, Current SmokerPatient did not abstain from smoking.,    Pulmonary exam normal breath sounds clear to auscultation       Cardiovascular hypertension, Pt. on medications Normal cardiovascular exam Rhythm:Regular Rate:Normal     Neuro/Psych negative neurological ROS     GI/Hepatic Neg liver ROS, GERD  Medicated and Controlled,  Endo/Other  Morbid obesity  Renal/GU negative Renal ROS  negative genitourinary   Musculoskeletal negative musculoskeletal ROS (+)   Abdominal   Peds negative pediatric ROS (+)  Hematology   Anesthesia Other Findings Left AKA   Reproductive/Obstetrics negative OB ROS                             Anesthesia Physical Anesthesia Plan  ASA: 3  Anesthesia Plan: General   Post-op Pain Management:    Induction: Intravenous  PONV Risk Score and Plan: TIVA  Airway Management Planned: Nasal Cannula and Natural Airway  Additional Equipment:   Intra-op Plan:   Post-operative Plan:   Informed Consent: I have reviewed the patients History and Physical, chart, labs and discussed the procedure including the risks, benefits and alternatives for the proposed anesthesia with the patient or authorized representative who has indicated his/her understanding and acceptance.       Plan Discussed with: CRNA and Surgeon  Anesthesia Plan Comments:         Anesthesia Quick Evaluation

## 2021-06-13 NOTE — Anesthesia Postprocedure Evaluation (Signed)
Anesthesia Post Note  Patient: Natalie [name]  Procedure(s) Performed: COLONOSCOPY WITH PROPOFOL POLYPECTOMY  Patient location during evaluation: PACU Anesthesia Type: General Level of consciousness: awake and alert and oriented Pain management: pain level controlled Vital Signs Assessment: post-procedure vital signs reviewed and stable Respiratory status: spontaneous breathing, nonlabored ventilation and respiratory function stable Cardiovascular status: blood pressure returned to baseline and stable Postop Assessment: no apparent nausea or vomiting Anesthetic complications: no   No notable events documented.   Last Vitals:  Vitals:   06/13/21 0748 06/13/21 1024  BP: (!) 164/92 114/61  Pulse: 70 80  Resp: 16 18  Temp: 36.8 C 36.7 C  SpO2: 97% 98%    Last Pain:  Vitals:   06/13/21 1024  TempSrc: Oral  PainSc: 0-No pain                 Dequavious Harshberger C Desma Wilkowski

## 2021-06-13 NOTE — Transfer of Care (Signed)
Immediate Anesthesia Transfer of Care Note  Patient: Natalie [name]  Procedure(s) Performed: COLONOSCOPY WITH PROPOFOL POLYPECTOMY  Patient Location: PACU  Anesthesia Type:General  Level of Consciousness: awake, alert , oriented and patient cooperative  Airway & Oxygen Therapy: Patient Spontanous Breathing  Post-op Assessment: Report given to RN, Post -op Vital signs reviewed and stable and Patient moving all extremities X 4  Post vital signs: Reviewed and stable  Last Vitals:  Vitals Value Taken Time  BP    Temp    Pulse    Resp    SpO2      Last Pain:  Vitals:   06/13/21 0929  TempSrc:   PainSc: 0-No pain      Patients Stated Pain Goal: 7 (30/86/57 8469)  Complications: No notable events documented.

## 2021-06-13 NOTE — H&P (Signed)
Primary Care Physician:  Tilda Burrow, NP Primary Gastroenterologist:  Dr. Abbey Chatters  Pre-Procedure History & Physical: HPI:  Natalie [name] is a 65 y.o. female is here for a colonoscopy to be performed for surveillance purposes. Last TCS done 10/01/2015 by Dr. Gustavo Lah, 2 sessile polyps  Past Medical History:  Diagnosis Date   Cellulitis    Hypercholesterolemia    Hypertension     Past Surgical History:  Procedure Laterality Date   above the knee amputation     COLONOSCOPY WITH PROPOFOL N/A 10/01/2015   Procedure: COLONOSCOPY WITH PROPOFOL;  Surgeon: [name], MD;  Location: [facility] ENDOSCOPY;  Service: Endoscopy;  Laterality: N/A;    Prior to Admission medications   Medication Sig Start Date End Date Taking? Authorizing Provider  amLODipine (NORVASC) 10 MG tablet Take 10 mg by mouth daily. Reported on 10/01/2015   Yes [provider]  aspirin 81 MG chewable tablet Chew by mouth daily at 6 (six) AM.   Yes [provider]  atorvastatin (LIPITOR) 40 MG tablet Take 40 mg by mouth daily. 07/12/15  Yes [provider]  hydrALAZINE (APRESOLINE) 25 MG tablet Take by mouth in the morning, at noon, and at bedtime. 04/21/21 04/21/22 Yes [provider]  losartan (COZAAR) 25 MG tablet Take by mouth daily at 6 (six) AM. 05/20/21 05/20/22 Yes [provider]  omeprazole (PRILOSEC) 10 MG capsule Take 10 mg by mouth daily.   Yes [provider]  traZODone (DESYREL) 150 MG tablet Take 150 mg by mouth at bedtime. Reported on 10/01/2015 06/30/15  Yes [provider]    Allergies as of 05/31/2021   (No Known Allergies)    History reviewed. No pertinent family history.  Social History   Socioeconomic History   Marital status: Married    Spouse name: Not on file   Number of children: Not on file   Years of education: Not on file   Highest education level: Not on file  Occupational History   Not on file  Tobacco Use   Smoking  status: Some Days    Types: Cigarettes   Smokeless tobacco: Never  Substance and Sexual Activity   Alcohol use: No   Drug use: No   Sexual activity: Not on file  Other Topics Concern   Not on file  Social History Narrative   Not on file   Social Determinants of Health   Financial Resource Strain: Not on file  Food Insecurity: Not on file  Transportation Needs: Not on file  Physical Activity: Not on file  Stress: Not on file  Social Connections: Not on file  Intimate Partner Violence: Not on file    Review of Systems: See HPI, otherwise negative ROS  Physical Exam: Vital signs in last 24 hours: Temp:  [98.3 F (36.8 C)] 98.3 F (36.8 C) (11/07 0748) Pulse Rate:  [70] 70 (11/07 0748) Resp:  [16] 16 (11/07 0748) BP: (164)/(92) 164/92 (11/07 0748) SpO2:  [97 %] 97 % (11/07 0748)   General:   Alert,  Well-developed, well-nourished, pleasant and cooperative in NAD Head:  Normocephalic and atraumatic. Eyes:  Sclera clear, no icterus.   Conjunctiva pink. Ears:  Normal auditory acuity. Nose:  No deformity, discharge,  or lesions. Mouth:  No deformity or lesions, dentition normal. Neck:  Supple; no masses or thyromegaly. Lungs:  Clear throughout to auscultation.   No wheezes, crackles, or rhonchi. No acute distress. Heart:  Regular rate and rhythm; no murmurs, clicks, rubs,  or gallops. Abdomen:  Soft, nontender and nondistended. No masses, hepatosplenomegaly or hernias noted. Normal bowel sounds, without guarding, and without rebound.   Msk:  Symmetrical without gross deformities. Normal posture. Extremities:  Without clubbing or edema. Neurologic:  Alert and  oriented x4;  grossly normal neurologically. Skin:  Intact without significant lesions or rashes. Cervical Nodes:  No significant cervical adenopathy. Psych:  Alert and cooperative. Normal mood and affect.  Impression/Plan: Natalie [name] is here for a colonoscopy to be performed for surveillance purposes. Last  TCS done 10/01/2015 by Dr. Gustavo Lah, 2 sessile polyps  The risks of the procedure including infection, bleed, or perforation as well as benefits, limitations, alternatives and imponderables have been reviewed with the patient. Questions have been answered. All parties agreeable.

## 2021-06-13 NOTE — Op Note (Signed)
[facility] Patient Name: Natalie [name] Procedure Date: 06/13/2021 9:08 AM MRN: [id number] Date of Birth: 09-Jan-1956 Attending MD: Elon Alas. Abbey Chatters DO CSN: [id number] Age: 65 Admit Type: Outpatient Procedure:                Colonoscopy Indications:              High risk colon cancer surveillance: Personal                            history of colonic polyps Providers:                Elon Alas. Abbey Chatters, DO, Lambert Mody, Dereck Leep, Technician Referring MD:              Medicines:                See the Anesthesia note for documentation of the                            administered medications Complications:            No immediate complications. Estimated Blood Loss:     Estimated blood loss was minimal. Procedure:                Pre-Anesthesia Assessment:                           - The anesthesia plan was to use monitored                            anesthesia care (MAC).                           After obtaining informed consent, the colonoscope                            was passed under direct vision. Throughout the                            procedure, the patient's blood pressure, pulse, and                            oxygen saturations were monitored continuously. The                            [id number]-HQ190L ([id number]) scope was introduced through                            the anus and advanced to the the cecum, identified                            by appendiceal orifice and ileocecal valve. The                            colonoscopy was performed without difficulty. The  patient tolerated the procedure well. The quality                            of the bowel preparation was evaluated using the                            BBPS [facility] Bowel Preparation Scale) with scores                            of: Right Colon = 3, Transverse Colon = 3 and Left                            Colon = 3 (entire mucosa  seen well with no residual                            staining, small fragments of stool or opaque                            liquid). The total BBPS score equals 9. Scope In: 9:33:21 AM Scope Out: 10:15:28 AM Scope Withdrawal Time: 0 hours 38 minutes 32 seconds  Total Procedure Duration: 0 hours 42 minutes 7 seconds  Findings:      The perianal and digital rectal examinations were normal.      Non-bleeding internal hemorrhoids were found during endoscopy.      Many small and large-mouthed diverticula were found in the sigmoid colon       and descending colon.      Three sessile polyps were found in the descending colon and transverse       colon. The polyps were 6 to 8 mm in size. These polyps were removed with       a cold snare. Resection and retrieval were complete.      A 8 mm polyp was found in the sigmoid colon. The polyp was pedunculated.       The polyp was removed with a cold snare. Resection was complete, but the       polyp tissue was not retrieved.      A 6 mm polyp was found in the sigmoid colon. The polyp was sessile. The       polyp was removed with a cold snare. Resection and retrieval were       complete.      The exam was otherwise without abnormality. Impression:               - Non-bleeding internal hemorrhoids.                           - Diverticulosis in the sigmoid colon and in the                            descending colon.                           - Three 6 to 8 mm polyps in the descending colon  and in the transverse colon, removed with a cold                            snare. Resected and retrieved.                           - One 8 mm polyp in the sigmoid colon, removed with                            a cold snare. Complete resection. Polyp tissue not                            retrieved.                           - One 6 mm polyp in the sigmoid colon, removed with                            a cold snare. Resected and  retrieved.                           - The examination was otherwise normal. Moderate Sedation:      Per Anesthesia Care Recommendation:           - Patient has a contact number available for                            emergencies. The signs and symptoms of potential                            delayed complications were discussed with the                            patient. Return to normal activities tomorrow.                            Written discharge instructions were provided to the                            patient.                           - Resume previous diet.                           - Continue present medications.                           - Await pathology results.                           - Repeat colonoscopy in 3 years for surveillance.                           - Return to GI clinic PRN. Procedure Code(s):        ---  Professional ---                           [phone], Colonoscopy, flexible; with removal of                            tumor(s), polyp(s), or other lesion(s) by snare                            technique Diagnosis Code(s):        --- Professional ---                           K63.5, Polyp of colon                           Z86.010, Personal history of colonic polyps                           K64.8, Other hemorrhoids                           K57.30, Diverticulosis of large intestine without                            perforation or abscess without bleeding CPT copyright 2019 American Medical Association. All rights reserved. The codes documented in this report are preliminary and upon coder review may  be revised to meet current compliance requirements. Elon Alas. Abbey Chatters, DO [place] Abbey Chatters, DO 06/13/2021 10:18:39 AM This report has been signed electronically. Number of Addenda: 0

## 2021-06-14 LAB — SURGICAL PATHOLOGY

## 2021-06-16 ENCOUNTER — Encounter (HOSPITAL_COMMUNITY): Payer: Self-pay | Admitting: Internal Medicine

## 2021-06-21 ENCOUNTER — Other Ambulatory Visit: Payer: Self-pay

## 2021-06-21 ENCOUNTER — Ambulatory Visit (HOSPITAL_COMMUNITY)
Admission: RE | Admit: 2021-06-21 | Discharge: 2021-06-21 | Disposition: A | Discharge status: Home / Self Care | Payer: Medicare HMO | Source: Ambulatory Visit | Attending: Nephrology | Admitting: Nephrology

## 2021-06-21 DIAGNOSIS — N281 Cyst of kidney, acquired: Secondary | ICD-10-CM | POA: Insufficient documentation

## 2021-06-21 DIAGNOSIS — R809 Proteinuria, unspecified: Secondary | ICD-10-CM | POA: Insufficient documentation

## 2021-06-21 DIAGNOSIS — Z716 Tobacco abuse counseling: Secondary | ICD-10-CM | POA: Diagnosis present

## 2021-06-21 DIAGNOSIS — I129 Hypertensive chronic kidney disease with stage 1 through stage 4 chronic kidney disease, or unspecified chronic kidney disease: Secondary | ICD-10-CM | POA: Diagnosis present

## 2021-06-21 DIAGNOSIS — N184 Chronic kidney disease, stage 4 (severe): Secondary | ICD-10-CM | POA: Insufficient documentation

## 2021-10-25 ENCOUNTER — Emergency Department (HOSPITAL_COMMUNITY)
Admission: EM | Admit: 2021-10-25 | Discharge: 2021-10-25 | Disposition: A | Discharge status: Home / Self Care | Payer: Medicare HMO | Attending: Emergency Medicine | Admitting: Emergency Medicine

## 2021-10-25 ENCOUNTER — Emergency Department (HOSPITAL_COMMUNITY): Payer: Medicare HMO

## 2021-10-25 ENCOUNTER — Encounter (HOSPITAL_COMMUNITY): Payer: Self-pay | Admitting: *Deleted

## 2021-10-25 ENCOUNTER — Other Ambulatory Visit: Payer: Self-pay

## 2021-10-25 DIAGNOSIS — M5441 Lumbago with sciatica, right side: Secondary | ICD-10-CM | POA: Diagnosis not present

## 2021-10-25 DIAGNOSIS — Z79899 Other long term (current) drug therapy: Secondary | ICD-10-CM | POA: Diagnosis not present

## 2021-10-25 DIAGNOSIS — Z7982 Long term (current) use of aspirin: Secondary | ICD-10-CM | POA: Diagnosis not present

## 2021-10-25 DIAGNOSIS — R200 Anesthesia of skin: Secondary | ICD-10-CM | POA: Diagnosis present

## 2021-10-25 DIAGNOSIS — M5431 Sciatica, right side: Secondary | ICD-10-CM

## 2021-10-25 MED ORDER — METHYLPREDNISOLONE SODIUM SUCC 125 MG IJ SOLR
125.0000 mg | Freq: Once | INTRAMUSCULAR | Status: AC
  Administered 2021-10-25: 125 mg via INTRAMUSCULAR
  Filled 2021-10-25: qty 2

## 2021-10-25 MED ORDER — KETOROLAC TROMETHAMINE 60 MG/2ML IM SOLN
60.0000 mg | Freq: Once | INTRAMUSCULAR | Status: AC
  Administered 2021-10-25: 60 mg via INTRAMUSCULAR
  Filled 2021-10-25: qty 2

## 2021-10-25 NOTE — ED Triage Notes (Signed)
Right leg numbness onset 1100 today after sitting on the toilet for awhile, denies numbness in arm ?

## 2021-10-25 NOTE — Discharge Instructions (Addendum)
Tylenol for discomfort at home.  Follow up with your Physician for recheck  ?

## 2021-10-25 NOTE — ED Provider Notes (Signed)
?[place] ?Provider Note ? ? ?CSN: [id number] ?Arrival date & time: 10/25/21  1817 ? ?  ? ?History ? ?Chief Complaint  ?Patient presents with  ? Numbness  ? ? ?Natalie [name] is a 66 y.o. female. ? ?Patient reports numbness to her right thigh and upper right leg after going to the bathroom and sitting on the toilet.  Reports that she was sitting for approximately 10 minutes.  Patient states she went back and sat on the sofa.  Complains of a pain from right buttock around to her right thigh.  Patient reports numbness and tingling sensation.  Patient denies any past medical history of back problems she denies any sciatic problems in the past.  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  Patient denies any redness or swelling to her leg.  Patient reports some discomfort in her low back but mostly in her right buttock.  Patient denies any numbness in her calf.  She denies any foot numbness. ? ? ? ?  ? ?Home Medications ?Prior to Admission medications   ?Medication Sig Start Date End Date Taking? Authorizing Provider  ?amLODipine (NORVASC) 10 MG tablet Take 10 mg by mouth daily. Reported on 10/01/2015    [provider]  ?aspirin 81 MG chewable tablet Chew by mouth daily at 6 (six) AM.    [provider]  ?atorvastatin (LIPITOR) 40 MG tablet Take 40 mg by mouth daily. 07/12/15   [provider]  ?hydrALAZINE (APRESOLINE) 25 MG tablet Take by mouth in the morning, at noon, and at bedtime. 04/21/21 04/21/22  [provider]  ?losartan (COZAAR) 25 MG tablet Take by mouth daily at 6 (six) AM. 05/20/21 05/20/22  [provider]  ?omeprazole (PRILOSEC) 10 MG capsule Take 10 mg by mouth daily.    [provider]  ?traZODone (DESYREL) 150 MG tablet Take 150 mg by mouth at bedtime. Reported on 10/01/2015 06/30/15   [provider]  ?   ? ?Allergies    ?Patient has no known allergies.   ? ?Review of Systems   ?Review  of Systems  ?All other systems reviewed and are negative. ? ?Physical Exam ?Updated Vital Signs ?BP (!) 158/95   Pulse 76   Temp 98 ?F (36.7 ?C)   Resp 18   Ht 5' 2.5" (1.588 m)   Wt 98.4 kg   SpO2 100%   BMI 39.06 kg/m?  ?Physical Exam ?Vitals and nursing note reviewed.  ?Constitutional:   ?   Appearance: She is well-developed.  ?HENT:  ?   Head: Normocephalic.  ?Cardiovascular:  ?   Rate and Rhythm: Normal rate.  ?Pulmonary:  ?   Effort: Pulmonary effort is normal.  ?Abdominal:  ?   General: There is no distension.  ?Musculoskeletal:     ?   General: Normal range of motion.  ?   Cervical back: Normal range of motion.  ?Skin: ?   General: Skin is warm.  ?Neurological:  ?   General: No focal deficit present.  ?   Mental Status: She is alert and oriented to person, place, and time.  ? ? ?ED Results / Procedures / Treatments   ?Labs ?(all labs ordered are listed, but only abnormal results are displayed) ?Labs Reviewed - No data to display ? ?EKG ?None ? ?Radiology ?DG Lumbar Spine Complete ? ?Result Date: 10/25/2021 ?CLINICAL DATA:  Pain. EXAM: LUMBAR SPINE - COMPLETE 4+ VIEW COMPARISON:  None. FINDINGS: There is  no evidence of lumbar spine fracture. Alignment is normal. Intervertebral disc spaces are maintained. Endplate osteophytes are seen throughout the lumbar spine. The bones are osteopenic. Vascular stent is seen in the left pelvis. IMPRESSION: 1. No acute bony abnormality. 2. Mild degenerative changes. Electronically Signed   By: Ronney Asters M.D.   On: 10/25/2021 22:57   ? ?Procedures ?Procedures  ? ? ?Medications Ordered in ED ?Medications  ?methylPREDNISolone sodium succinate (SOLU-MEDROL) 125 mg/2 mL injection 125 mg (125 mg Intramuscular Given 10/25/21 2204)  ?ketorolac (TORADOL) injection 60 mg (60 mg Intramuscular Given 10/25/21 2204)  ? ? ?ED Course/ Medical Decision Making/ A&P ?  ?                        ?Medical Decision Making ?Amount and/or Complexity of Data Reviewed ?Radiology: ordered and  independent interpretation performed. Decision-making details documented in ED Course. ?   Details: Xray lumbar spine ordered, reviewed and interpreted. Xray shows mild degenerative changes ? ?Risk ?Prescription drug management. ?Risk Details: Pt given an injection of toradol and solumedrol   ? ? ? ? ? ? ? ? ? ? ?Final Clinical Impression(s) / ED Diagnoses ?Final diagnoses:  ?Sciatica of right side  ? ? ?Rx / DC Orders ?ED Discharge Orders   ? ? None  ? ?  ? ?An After Visit Summary was printed and given to the patient. ? ?  ?Fransico Meadow, [place] ?10/25/21 2306 ? ?  ?Varney Biles, MD ?10/27/21 2040 ? ?

## 2021-11-12 ENCOUNTER — Emergency Department (HOSPITAL_COMMUNITY)
Admission: EM | Admit: 2021-11-12 | Discharge: 2021-11-12 | Disposition: A | Discharge status: Home / Self Care | Payer: Medicare HMO | Attending: Emergency Medicine | Admitting: Emergency Medicine

## 2021-11-12 ENCOUNTER — Other Ambulatory Visit: Payer: Self-pay

## 2021-11-12 ENCOUNTER — Emergency Department (HOSPITAL_COMMUNITY): Payer: Medicare HMO

## 2021-11-12 ENCOUNTER — Encounter (HOSPITAL_COMMUNITY): Payer: Self-pay

## 2021-11-12 DIAGNOSIS — M5431 Sciatica, right side: Secondary | ICD-10-CM

## 2021-11-12 DIAGNOSIS — Z79899 Other long term (current) drug therapy: Secondary | ICD-10-CM | POA: Insufficient documentation

## 2021-11-12 DIAGNOSIS — M5441 Lumbago with sciatica, right side: Secondary | ICD-10-CM | POA: Diagnosis not present

## 2021-11-12 DIAGNOSIS — M79604 Pain in right leg: Secondary | ICD-10-CM | POA: Diagnosis present

## 2021-11-12 DIAGNOSIS — Z7982 Long term (current) use of aspirin: Secondary | ICD-10-CM | POA: Diagnosis not present

## 2021-11-12 DIAGNOSIS — I10 Essential (primary) hypertension: Secondary | ICD-10-CM | POA: Diagnosis not present

## 2021-11-12 DIAGNOSIS — R200 Anesthesia of skin: Secondary | ICD-10-CM | POA: Diagnosis not present

## 2021-11-12 HISTORY — DX: Sciatica, unspecified side: M54.30

## 2021-11-12 LAB — CBC WITH DIFFERENTIAL/PLATELET
Abs Immature Granulocytes: 0.04 10*3/uL (ref 0.00–0.07)
Basophils Absolute: 0.1 10*3/uL (ref 0.0–0.1)
Basophils Relative: 0 %
Eosinophils Absolute: 0.1 10*3/uL (ref 0.0–0.5)
Eosinophils Relative: 1 %
HCT: 44.3 % (ref 36.0–46.0)
Hemoglobin: 14.3 g/dL (ref 12.0–15.0)
Immature Granulocytes: 0 %
Lymphocytes Relative: 12 %
Lymphs Abs: 1.3 10*3/uL (ref 0.7–4.0)
MCH: 27.2 pg (ref 26.0–34.0)
MCHC: 32.3 g/dL (ref 30.0–36.0)
MCV: 84.2 fL (ref 80.0–100.0)
Monocytes Absolute: 0.5 10*3/uL (ref 0.1–1.0)
Monocytes Relative: 4 %
Neutro Abs: 9.3 10*3/uL — ABNORMAL HIGH (ref 1.7–7.7)
Neutrophils Relative %: 83 %
Platelets: 341 10*3/uL (ref 150–400)
RBC: 5.26 MIL/uL — ABNORMAL HIGH (ref 3.87–5.11)
RDW: 16.1 % — ABNORMAL HIGH (ref 11.5–15.5)
WBC: 11.3 10*3/uL — ABNORMAL HIGH (ref 4.0–10.5)
nRBC: 0 % (ref 0.0–0.2)

## 2021-11-12 LAB — COMPREHENSIVE METABOLIC PANEL
ALT: 10 U/L (ref 0–44)
AST: 14 U/L — ABNORMAL LOW (ref 15–41)
Albumin: 3.8 g/dL (ref 3.5–5.0)
Alkaline Phosphatase: 130 U/L — ABNORMAL HIGH (ref 38–126)
Anion gap: 9 (ref 5–15)
BUN: 25 mg/dL — ABNORMAL HIGH (ref 8–23)
CO2: 26 mmol/L (ref 22–32)
Calcium: 9.9 mg/dL (ref 8.9–10.3)
Chloride: 105 mmol/L (ref 98–111)
Creatinine, Ser: 2.2 mg/dL — ABNORMAL HIGH (ref 0.44–1.00)
GFR, Estimated: 24 mL/min — ABNORMAL LOW (ref >60)
Glucose, Bld: 107 mg/dL — ABNORMAL HIGH (ref 70–99)
Potassium: 3.6 mmol/L (ref 3.5–5.1)
Sodium: 140 mmol/L (ref 135–145)
Total Bilirubin: 0.6 mg/dL (ref 0.3–1.2)
Total Protein: 7.5 g/dL (ref 6.5–8.1)

## 2021-11-12 MED ORDER — METHYLPREDNISOLONE SODIUM SUCC 125 MG IJ SOLR
125.0000 mg | Freq: Once | INTRAMUSCULAR | Status: AC
  Administered 2021-11-12: 125 mg via INTRAMUSCULAR
  Filled 2021-11-12: qty 2

## 2021-11-12 MED ORDER — PREDNISONE 10 MG PO TABS: 20.0000 mg | ORAL_TABLET | Freq: Every day | ORAL | 0 refills | Status: DC

## 2021-11-12 NOTE — Discharge Instructions (Signed)
Follow-up with your family doctor in a week.  If not improving you may need an MRI and possible referral to neurosurgery ?

## 2021-11-12 NOTE — ED Triage Notes (Signed)
"  Right leg pain and numbness, history of sciatica" per patient. ?

## 2021-11-12 NOTE — ED Provider Notes (Signed)
?[facility] ?Provider Note ? ? ?CSN: [id number] ?Arrival date & time: 11/12/21  1447 ? ?  ? ?History ? ?Chief Complaint  ?Patient presents with  ? Leg Pain  ? ? ?Natalie [name] is a 66 y.o. female. ? ?Patient complains of numbness in her right leg.  She is having some pain in her back.  Patient has a history of hypertension and renal insufficiency and sciatica ? ?The history is provided by the patient and medical records. A language interpreter was used.  ?Leg Pain ?Location:  Leg ?Injury: no   ?Leg location:  R leg ?Pain details:  ?  Quality:  Aching ?  Radiates to:  Does not radiate ?  Severity:  Moderate ?  Onset quality:  Sudden ?  Timing:  Constant ?  Progression:  Waxing and waning ?Chronicity:  New ?Associated symptoms: no back pain and no fatigue   ? ?  ? ?Home Medications ?Prior to Admission medications   ?Medication Sig Start Date End Date Taking? Authorizing Provider  ?amLODipine (NORVASC) 10 MG tablet Take 10 mg by mouth daily. Reported on 10/01/2015   Yes [provider]  ?aspirin 81 MG chewable tablet Chew by mouth daily at 6 (six) AM.   Yes [provider]  ?atorvastatin (LIPITOR) 40 MG tablet Take 40 mg by mouth daily. 07/12/15  Yes [provider]  ?cinacalcet (SENSIPAR) 30 MG tablet Take 30 mg by mouth daily. 10/14/21  Yes [provider]  ?hydrALAZINE (APRESOLINE) 100 MG tablet Take 100 mg by mouth 2 (two) times daily. 10/14/21  Yes [provider]  ?losartan (COZAAR) 50 MG tablet Take by mouth. 08/10/21 08/10/22 Yes [provider]  ?omeprazole (PRILOSEC) 10 MG capsule Take 10 mg by mouth daily.   Yes [provider]  ?predniSONE (DELTASONE) 10 MG tablet Take 2 tablets (20 mg total) by mouth daily. 11/12/21  Yes Milton Ferguson, MD  ?traZODone (DESYREL) 150 MG tablet Take 150 mg by mouth at bedtime. Reported on 10/01/2015 06/30/15  Yes [provider]  ?   ? ?Allergies    ?Patient has no known allergies.   ? ?Review  of Systems   ?Review of Systems  ?Constitutional:  Negative for appetite change and fatigue.  ?HENT:  Negative for congestion, ear discharge and sinus pressure.   ?Eyes:  Negative for discharge.  ?Respiratory:  Negative for cough.   ?Cardiovascular:  Negative for chest pain.  ?Gastrointestinal:  Negative for abdominal pain and diarrhea.  ?Genitourinary:  Negative for frequency and hematuria.  ?Musculoskeletal:  Negative for back pain.  ?Skin:  Negative for rash.  ?Neurological:  Negative for seizures and headaches.  ?     Numbness and pain in right leg and back  ?Psychiatric/Behavioral:  Negative for hallucinations.   ? ?Physical Exam ?Updated Vital Signs ?BP (!) 109/51   Pulse 96   Temp 98 ?F (36.7 ?C) (Oral)   Resp (!) 24   Ht '5\' 2"'$  (1.575 m)   Wt 98.4 kg   SpO2 95%   BMI 39.69 kg/m?  ?Physical Exam ?Vitals and nursing note reviewed.  ?Constitutional:   ?   Appearance: She is well-developed.  ?HENT:  ?   Head: Normocephalic.  ?Eyes:  ?   General: No scleral icterus. ?   Conjunctiva/sclera: Conjunctivae normal.  ?Neck:  ?   Thyroid: No thyromegaly.  ?Cardiovascular:  ?   Rate and Rhythm: Normal rate and regular rhythm.  ?   Heart sounds: No murmur  heard. ?  No friction rub. No gallop.  ?Pulmonary:  ?   Breath sounds: No stridor. No wheezing or rales.  ?Chest:  ?   Chest wall: No tenderness.  ?Abdominal:  ?   General: There is no distension.  ?   Tenderness: There is no abdominal tenderness. There is no rebound.  ?Musculoskeletal:     ?   General: Normal range of motion.  ?   Cervical back: Neck supple.  ?   Comments: Positive straight leg on right  ?Lymphadenopathy:  ?   Cervical: No cervical adenopathy.  ?Skin: ?   Findings: No erythema or rash.  ?Neurological:  ?   Mental Status: She is alert and oriented to person, place, and time.  ?   Motor: No abnormal muscle tone.  ?   Coordination: Coordination normal.  ?Psychiatric:     ?   Behavior: Behavior normal.  ? ? ?ED Results / Procedures / Treatments    ?Labs ?(all labs ordered are listed, but only abnormal results are displayed) ?Labs Reviewed  ?CBC WITH DIFFERENTIAL/PLATELET - Abnormal; Notable for the following components:  ?    Result Value  ? WBC 11.3 (*)   ? RBC 5.26 (*)   ? RDW 16.1 (*)   ? Neutro Abs 9.3 (*)   ? All other components within normal limits  ?COMPREHENSIVE METABOLIC PANEL - Abnormal; Notable for the following components:  ? Glucose, Bld 107 (*)   ? BUN 25 (*)   ? Creatinine, Ser 2.20 (*)   ? AST 14 (*)   ? Alkaline Phosphatase 130 (*)   ? GFR, Estimated 24 (*)   ? All other components within normal limits  ? ? ?EKG ?None ? ?Radiology ?CT Lumbar Spine Wo Contrast ? ?Result Date: 11/12/2021 ?CLINICAL DATA:  Right leg pain and numbness intermittently for several weeks. EXAM: CT LUMBAR SPINE WITHOUT CONTRAST TECHNIQUE: Multidetector CT imaging of the lumbar spine was performed without intravenous contrast administration. Multiplanar CT image reconstructions were also generated. RADIATION DOSE REDUCTION: This exam was performed according to the departmental dose-optimization program which includes automated exposure control, adjustment of the mA and/or kV according to patient size and/or use of iterative reconstruction technique. COMPARISON:  Lumbar spine radiographs 10/25/2021. FINDINGS: Segmentation: There are 5 lumbar type vertebral bodies. Alignment: Straightening without focal angulation or listhesis. Vertebrae: No evidence of acute lumbar spine fracture or pars defect. No suspicious osseous findings. There are mild endplate degenerative changes. Paraspinal and other soft tissues: No acute paraspinal findings are seen. There is aortic and branch vessel atherosclerosis status post aorto bi-iliac bypass grafting. There is a left external iliac stent. Marked left renal atrophy noted. There are multiple subcutaneous calcifications within the back. Emphysematous changes at both lung bases. Disc levels: L1-2: Mild disc bulging and facet hypertrophy.  No evidence of spinal stenosis. L2-3: Mild disc bulging and facet hypertrophy. No evidence of spinal stenosis. L3-4: Mild disc bulging with mild to moderate facet and ligamentous hypertrophy. Mild triangulation of the thecal sac with mild lateral recess narrowing bilaterally. The foramina are sufficiently patent. L4-5: Mild disc bulging with mild facet and ligamentous hypertrophy. Mild lateral recess narrowing bilaterally. The foramina appear sufficiently patent. L5-S1: Maintained disc height. Mild bilateral facet hypertrophy. No significant spinal stenosis. IMPRESSION: 1. No acute lumbar spine findings or malalignment. 2. Mild multilevel spondylosis as described with mild lateral recess narrowing bilaterally at L3-4 and L4-5. No large disc herniation or significant foraminal narrowing identified. 3. Previous aorto bi-iliac grafting.  Aortic Atherosclerosis (ICD10-I70.0). Electronically Signed   By: Richardean Sale M.D.   On: 11/12/2021 16:00   ? ?Procedures ?Procedures  ? ? ?Medications Ordered in ED ?Medications  ?methylPREDNISolone sodium succinate (SOLU-MEDROL) 125 mg/2 mL injection 125 mg (125 mg Intramuscular Given 11/12/21 1622)  ? ? ?ED Course/ Medical Decision Making/ A&P ?  ?                        ?Medical Decision Making ?Amount and/or Complexity of Data Reviewed ?Labs: ordered. ?Radiology: ordered. ? ?Risk ?Prescription drug management. ? ?This patient presents to the ED for concern of right leg numbness and pain, this involves an extensive number of treatment options, and is a complaint that carries with it a high risk of complications and morbidity.  The differential diagnosis includes sciatica, stroke ? ? ?Co morbidities that complicate the patient evaluation ? ?Hypertension and renal insufficiency ? ? ?Additional history obtained: ? ?Additional history obtained from pt. ?External records from outside source obtained and reviewed including hospital record ? ? ?Lab Tests: ? ?I Ordered, and personally  interpreted labs.  The pertinent results include: CBC and chemistries which showed elevated white count 11.3 and creatinine two-point ? ? ?Imaging Studies ordered: ? ?I ordered imaging studies including lumbar serie

## 2021-12-20 ENCOUNTER — Other Ambulatory Visit (HOSPITAL_COMMUNITY): Payer: Self-pay | Admitting: Family Medicine

## 2021-12-20 DIAGNOSIS — M629 Disorder of muscle, unspecified: Secondary | ICD-10-CM

## 2022-03-15 ENCOUNTER — Ambulatory Visit (HOSPITAL_COMMUNITY): Payer: Medicare PPO | Attending: Family Medicine

## 2022-03-15 DIAGNOSIS — M6281 Muscle weakness (generalized): Secondary | ICD-10-CM | POA: Diagnosis present

## 2022-03-15 DIAGNOSIS — R29898 Other symptoms and signs involving the musculoskeletal system: Secondary | ICD-10-CM | POA: Diagnosis present

## 2022-03-15 NOTE — Therapy (Signed)
OUTPATIENT OCCUPATIONAL THERAPY WC EVALUATION  Patient Name: Natalie [name] MRN: [id number] DOB:[birth year], 66 y.o., female Today's Date: 03/15/2022  PCP: Burman Freestone, [place] REFERRING PROVIDER: Mallie Snooks, NP   OT End of Session - 03/15/22 1708     Visit Number 1    Number of Visits 1    Date for OT Re-Evaluation 03/16/22    Authorization Type [employer] HMA    Authorization Time Period requesting 1 eval    OT Start Time 0945    OT Stop Time 1020    OT Time Calculation (min) 35 min    Activity Tolerance Patient tolerated treatment well    Behavior During Therapy WFL for tasks assessed/performed             Past Medical History:  Diagnosis Date   Cellulitis    Hypercholesterolemia    Hypertension    Sciatica    Past Surgical History:  Procedure Laterality Date   above the knee amputation     COLONOSCOPY WITH PROPOFOL N/A 10/01/2015   Procedure: COLONOSCOPY WITH PROPOFOL;  Surgeon: [name], MD;  Location: [facility] ENDOSCOPY;  Service: Endoscopy;  Laterality: N/A;   COLONOSCOPY WITH PROPOFOL N/A 06/13/2021   Procedure: COLONOSCOPY WITH PROPOFOL;  Surgeon: Eloise Harman, DO;  Location: [facility] ENDO SUITE;  Service: Endoscopy;  Laterality: N/A;  10:00 / ASA III   POLYPECTOMY  06/13/2021   Procedure: POLYPECTOMY;  Surgeon: Eloise Harman, DO;  Location: [facility] ENDO SUITE;  Service: Endoscopy;;   There are no problems to display for this patient.   Mobility/Seating Evaluation    PATIENT INFORMATION: Name: Natalie [name]  DOB: 01-21-1956  Sex: Female Date seen: 03/15/22 Time: 9:45am  Address:  [address] [place] [address] Physician: Mallie Snooks, NP This evaluation/justification form will serve as the LMN for the following suppliers: __________________________ Supplier: [employer] Contact Person:  Luz Brazen, ATP Phone:  [phone]   Seating Therapist: Luz Brazen, Wess Botts  Phone:   [phone]   Phone: [phone]     Spouse/Parent/Caregiver name: Teia Freitas  Phone number: [phone] Insurance/Payer: [facility] Medicare Choice PPO     Reason for Referral: Power [facility]  Patient/Caregiver Goals: Improved safe mobility around the home.   Patient was seen for face-to-face evaluation for new power wheelchair.  Also present was [employer], [employer] to discuss recommendations and wheelchair options.  Further paperwork was completed and sent to vendor.  Patient appears to qualify for power mobility device at this time per objective findings.   MEDICAL HISTORY: Diagnosis: Primary Diagnosis: Left AKA  Onset: 2015 Diagnosis:    _0 Progressive Disease Relevant past and future surgeries: Left Above knee amputation 2015   Height: 5'4 Weight: 217 Explain recent changes or trends in weight:    History including Falls: Pt reports     HOME ENVIRONMENT: _1 House  _2 Condo/town home  _3 Apartment  _4 Assisted Living    _5 Lives Alone _6  Lives with Others                                                                                          Hours with caregiver:   _7 Home is  accessible to patient           Stairs      _0 Yes _1  No     Ramp _2 Yes _3 No Comments:  Pt lives in a trailer home. Manual wc not accessible through bathroom door or bedroom door    COMMUNITY ADL: TRANSPORTATION: _4 Car    _5 Van    <BMWUXLKGMWNUUVOZ>_3<\/GUYQIHKVQQVZDGLO>_7 Public Transportation    _7 [employer] w/c Lift    _8 Ambulance    _9 Other:       _10 Sits in wheelchair during transport  Employment/School:  Specific requirements pertaining to mobility   Other: Pt arrived in public transportation today, pt reports husband provides transportation as well    FUNCTIONAL/SENSORY PROCESSING SKILLS:  Handedness:   _11 Right     _12 Left    _13 NA  Comments:    Functional Processing Skills for Wheeled Mobility _14 Processing Skills are adequate for safe wheelchair operation  Areas of concern than may interfere with safe operation of wheelchair Description of problem   _15  Attention to environment       _16 Judgment      _17  Hearing  _18  Vision or visual processing      _19 Motor Planning  _20  Fluctuations in Behavior      VERBAL COMMUNICATION: _21 WFL receptive _22  WFL expressive _23 Understandable  _24 Difficult to understand  _25 non-communicative _26  Uses an augmented communication device  CURRENT SEATING / MOBILITY: Current Mobility Base:  _27 None _28 Dependent _29 Manual _30 Scooter _31 Power  Type of Control:   Manufacturer:  Size:  Age:   Current Condition of Mobility Base:  Pt reports that she has a manual wc that she uses around the house. she arrived today with crutches   Current Wheelchair components:    Describe posture in present seating system:        SENSATION and SKIN ISSUES: Sensation _32 Intact  _33 Impaired _34 Absent  Level of sensation:  Pressure Relief: Able to perform effective pressure relief :    _35 Yes  _36  No Method: Standing with crutches or walker If not, Why?:   Skin Issues/Skin Integrity Current Skin Issues  _37 Yes _38 No _39 Intact _40  Red area_41  Open Area  _42 Scar Tissue _43 At risk from prolonged sitting Where    History of Skin Issues  _44 Yes _45 No Where   When    Hx of skin flap surgeries  _46 Yes _47 No Where   When    Limited sitting tolerance _48 Yes _49 No Hours spent sitting in wheelchair daily: 12  Complaint of Pain:  Please describe: NA   Swelling/Edema: NA   ADL STATUS (in reference to wheelchair use):  Indep Assist Unable Indep with Equip Not assessed Comments  Dressing _50  _51  _52  _53  _54  Crutches, walker, or manual wc required for transfers or prolonged standing  Eating _55  _56  _57  _58  _59    Toileting _60  _61  _62  _63  _64  Crutches, walker, or manual wc required for transfers or prolonged standing  Bathing _65  _66  _67  _68  _69  Crutches, walker, or manual wc required for transfers or prolonged standing  Grooming/Hygiene _70  _71  _72  _73  _74  Crutches, walker, or manual wc required for transfers or prolonged standing  Meal Prep _75  _76  _77  _78  _79  Crutches, walker, or manual wc required for  transfers or prolonged standing  IADLS <FIEPPIRJJOACZYSA>_6<\/TKZSWFUXNATFTDDU>_20  _81  _82  _83  _84  Crutches, walker, or manual wc required for transfers or prolonged standing  Bowel Management: _85 Continent  _86 Incontinent  _87 Accidents Comments:    Bladder Management: _88 Continent  _89 Incontinent  _90 Accidents Comments:       WHEELCHAIR SKILLS: Manual w/c Propulsion: _91 UE or LE strength and endurance sufficient to participate  in ADLs using manual wheelchair Arm : _0 left _1 right   _2 Both      Distance:  Foot:  _3 left _4 right   _5 Both  Operate Scooter: _6  Strength, hand grip, balance and transfer appropriate for use _7 Living environment is accessible for use of scooter  Operate Power w/c:  _8  Std. Joystick   _9  Alternative Controls Indep _10  Assist _11  Dependent/unable _12  N/A _13   _14 Safe          _15  Functional      Distance:   Bed confined without wheelchair _16  Yes _17  No   STRENGTH/RANGE OF MOTION:  Active Range of Motion Strength  Shoulder WNL;  5/5  Elbow WNL 5/5  Wrist/Hand WNL 5/5  Hip WFL; Left AKA right 5/5; left NA,AKA  Knee Right WNL; L AKA right 5/5; left NA, AKA  Ankle Left AKA, right WNL  right 5/5; L AKA     MOBILITY/BALANCE:  _18  Patient is totally dependent for mobility      Balance Transfers Ambulation  Sitting Balance: Standing Balance: _19  Independent; with use of crutches _20  Independent/Modified Independent  _21  WFL     _22  WFL; with use of crutches _23  Supervision _24  Supervision  _25  Uses UE for balance  _26  Supervision _27  Min Assist _28  Ambulates with Assist      _29  Min Assist _30  Min assist _31  Mod Assist _32  Ambulates with Device:      _33  RW  _34  StW  _35  Cane  _36  crutches  _37  Mod Assist _38  Mod assist _39  Max assist   _40  Max Assist _41  Max assist _42  Dependent _43  Indep. Short Distance Only  _44  Unable _45  Unable _46  Lift / Sling Required Distance (in feet)  30 ft   _47  Sliding board _48  Unable to Ambulate (see explanation below)  Cardio Status:  _49 Intact  _50  Impaired   _51  NA       Respiratory Status:   _52 Intact   _53 Impaired   _54 NA       Orthotics/Prosthetics: left leg prosthetic, unable to walk on prothestic without use of walker or crutches  Comments (Address manual vs power w/c vs scooter): Pt's current manual wc does not fit through her bedroom and bathroom doors.          Anterior / Posterior Obliquity Rotation-Pelvis   PELVIS    _55  _56  _57   Neutral Posterior Anterior  _58  _59  _60   WFL Rt elev Lt elev  _61  _62  _63   WFL Right Left                      Anterior    Anterior     _64  Fixed _65  Other _66  Partly Flexible _67  Flexible   _68  Fixed _69  Other _70  Partly Flexible  _71  Flexible  _72  Fixed _73  Other _74  Partly Flexible  _75  Flexible   TRUNK  _76  _77  _78   [facility]  Thoracic  Lumbar  Kyphosis Lordosis  _79  _80  _81   WFL Convex Convex  Right Left _82 c-curve _83 s-curve _84 multiple  _85  Neutral _86  Left-anterior _87  Right-anterior     _88  Fixed _89  Flexible _90  Partly Flexible _91  Other  _92  Fixed _93  Flexible _94  Partly Flexible _95  Other  _96  Fixed             _97  Flexible _98  Partly Flexible _99  Other    Position Windswept    HIPS          _100            _101               _102   Neutral       Abduct        ADduct         _0           _1            _2   Neutral Right           Left      _3  Fixed _4  Subluxed _5  Partly Flexible _6  Dislocated _7  Flexible  _8  Fixed _9  Other _10  Partly Flexible  _11  Flexible                 Foot Positioning Knee Positioning      _12  WFL  _13 Lt _14 Rt _15  WFL  _16 Lt _17 Rt    KNEES ROM concerns: ROM concerns:    & Dorsi-Flexed _18 Lt _19 Rt     FEET Plantar Flexed _20 Lt _21 Rt      Inversion                 _22 Lt _23 Rt      Eversion                 _24 Lt _25 Rt     HEAD _26  Functional _27  Good Head Control    & _28  Flexed         _29  Extended _30  Adequate Head Control    NECK _31  Rotated  Lt  _32  Lat Flexed Lt _33  Rotated  Rt _34  Lat Flexed Rt _35  Limited Head Control     _36  Cervical Hyperextension _37  Absent  Head Control     SHOULDERS ELBOWS WRIST& HAND       Left     Right    Left      Right    Left     Right   U/E _38 Functional           _39 Functional   _40 Fisting             _41 Fisting      _42 elev   _43 dep      _44 elev   _45 dep       _46 pro -_47 retract     _48 pro  _49 retract _50 subluxed             _51 subluxed           Goals for Wheelchair Mobility  _52  Independence with mobility in the home with motor related ADLs (MRADLs)  _53  Independence with MRADLs in the community _54  Provide dependent mobility  _55  Provide recline     _56 Provide tilt   Goals for Seating system _57  Optimize pressure distribution _58  Provide support needed to facilitate function or safety _59  Provide corrective forces to assist with maintaining or improving posture _60  Accommodate client's posture:   current seated postures and positions are not flexible or will not tolerate corrective forces _61  Client to be independent with relieving pressure in the wheelchair _62 Enhance physiological function such as breathing, swallowing, digestion  Simulation ideas/Equipment trials: State why other equipment was unsuccessful:   MOBILITY BASE RECOMMENDATIONS and JUSTIFICATION: MOBILITY COMPONENT JUSTIFICATION  Manufacturer: JazzyModel: select   Size: Width 18Seat Depth 20 _63 provide transport from point A to B      _64 promote Indep mobility  _65 is not a safe, functional ambulator _66 walker or cane inadequate _67 non-standard width/depth necessary to accommodate anatomical measurement _68    _69 Manual Mobility Base _70 non-functional ambulator    _71 Scooter/POV  _72 can safely operate  _73 can safely transfer   _74 has adequate trunk stability  _75 cannot functionally propel manual w/c  _76 Power Mobility Base  _77 non-ambulatory, not  safely _0 cannot functionally propel manual wheelchair  _1  cannot functionally and safely operate scooter/POV _2 can safely operate and willing to  _3 Stroller Base _4 infant/child  _5 unable to propel manual wheelchair _6 allows for growth _7 non-functional ambulator _8 non-functional UE _9 Indep  mobility is not a goal at this time  _10 Tilt  _11 Forward _12 Backward _13 Powered tilt  _14 Manual tilt  _15 change position against gravitational force on head and shoulders  _16 change position for pressure relief/cannot weight shift _17 transfers  _18 management of tone _19 rest periods _20 control edema _21 facilitate postural control  _22    _23 Recline  _24 Power recline on power base _25 Manual recline on manual base  _26 accommodate femur to back angle  _27 bring to full recline for ADL care  _28 change position for pressure relief/cannot weight shift _29 rest periods _30 repositioning for transfers or clothing/diaper /catheter changes _31 head positioning  _32 Lighter weight required _33 self- propulsion  _34 lifting _35    _36 Heavy Duty required _37 user weight greater than 250# _38 extreme tone/ over active movement _39 broken frame on previous chair _40    _41  Back  _42  Angle Adjustable _43  Custom molded  _44 postural control _45 control of tone/spasticity _46 accommodation of range of motion _47 UE functional control _48 accommodation for seating system _49   _50 provide lateral trunk support _51 accommodate deformity _52 provide posterior trunk support _53 provide lumbar/sacral support _54 support trunk in midline _55 Pressure relief over spinal processes  _56  Seat Cushion  _57 impaired sensation  _58 decubitus ulcers present _59 history of pressure ulceration _60 prevent pelvic extension _61 low maintenance  _62 stabilize pelvis  _63 accommodate obliquity _64 accommodate multiple deformity _65 neutralize lower extremity position _66 increase pressure distribution _67    _68  Pelvic/thigh support  _69  Lateral thigh guide _70  Distal medial pad  _71  Distal lateral pad _72  pelvis in neutral _73 accommodate pelvis _74  position upper legs _75  alignment _76  accommodate ROM _77  decr adduction _78 accommodate tone _79 removable for transfers _80 decr abduction  _81  Lateral trunk Supports _82  Lt     _83  Rt _84 decrease lateral trunk leaning _85 control tone _86 contour for increased  contact _87 safety  _88 accommodate asymmetry _89    _90  Mounting hardware  _91 lateral trunk supports  _92 back   _93 seat _94 headrest      _95  thigh support _96 fixed   _97 swing away _98 attach seat platform/cushion to w/c frame _99 attach back cushion to w/c frame _100 mount postural supports _101 mount headrest  _102 swing medial thigh support away _103 swing lateral supports away for transfers  _104      Armrests  _105 fixed _106 adjustable height _107 removable   _108 swing away  _109 flip back   _110 reclining _111 full length pads _112 desk    _113 pads tubular  _114 provide support with elbow at 90   _115 provide support for w/c tray _116 change of height/angles for variable activities _117 remove for transfers _118 allow to come closer to table top _119 remove for access to tables _120    Hangers/ Leg rests  _121 60 _122 70 _123 90 _124 elevating _125 heavy duty  _126 articulating _127 fixed _128 lift off _129 swing away     _130 power _131 provide LE support  _132 accommodate to hamstring tightness _133 elevate legs during recline   _134 provide change in position for Legs _135 Maintain placement of feet on footplate _136 durability _137 enable transfers _138 decrease edema _139 Accommodate lower leg length _140    Foot support Footplate    <STMHDQQIWLNLGXQJ>_1<\/HERDEYCXKGYJEHUD>_149 Lt  _142  Rt  _143  Center mount _144 flip up     _145 depth/angle adjustable _146 Amputee adapter    _147  Lt     _148  Rt _149 provide foot support _150 accommodate to ankle ROM _151 transfers _152 Provide support for residual extremity _153  allow foot to go under wheelchair base _154  decrease tone  _155    _156  Ankle strap/heel loops _157 support foot on foot support _158 decrease extraneous movement _159 provide input to heel  _160 protect foot  Tires: _161 pneumatic  [  x]flat free inserts  _0 solid  _1 decrease maintenance  _2 prevent frequent flats _3 increase shock absorbency _4 decrease pain from road shock _5 decrease spasms from road shock _6    _7  Headrest  _8 provide posterior head support _9 provide posterior neck support _10 provide lateral head support _11 provide anterior head  support _12 support during tilt and recline _13 improve feeding   _14 improve respiration _15 placement of switches _16 safety  _17 accommodate ROM  _18 accommodate tone _19 improve visual orientation  _20  Anterior chest strap _21  Vest _22  Shoulder retractors  _23 decrease forward movement of shoulder _24 accommodation of TLSO _25 decrease forward movement of trunk _26 decrease shoulder elevation _27 added abdominal support _28 alignment _29 assistance with shoulder control  _30    Pelvic Positioner _31 Belt _32 SubASIS bar _33 Dual Pull _34 stabilize tone _35 decrease falling out of chair/ **will not Decr potential for sliding due to pelvic tilting _36 prevent excessive rotation _37 pad for protection over boney prominence _38 prominence comfort _39 special pull angle to control rotation _40    Upper Extremity Support _41 L   _42  R _43 Arm trough    _44 hand support _45  tray       _46 full tray _47 swivel mount _48 decrease edema      _49 decrease subluxation   _50 control tone   _51 placement for AAC/Computer/EADL _52 decrease gravitational pull on shoulders _53 provide midline positioning _54 provide support to increase UE function _55 provide hand support in natural position _56 provide work surface   POWER WHEELCHAIR CONTROLS  _57 Proportional  _58 Non-Proportional Type joystick _59 Left  _60 Right _61 provides access for controlling wheelchair   _62 lacks motor control to operate proportional drive control <QVZDGLOVFIEPPIRJ>_1<\/OACZYSAYTKZSWFUX>_32 unable to understand proportional controls  Actuator Control Module  _64 Single  _65 Multiple   _66 Allow the client to operate the power seat function(s) through the joystick control   _67 Safety Reset Switches _68 Used to change modes and stop the wheelchair when driving in latch mode    _69 [employer]   _70 programming for accurate control _71 progressive Disease/changing condition _72 non-proportional drive control needed _73 Needed in order to operate power seat functions through joystick control   _74 Display box _75 Allows user to see in which mode  and drive the wheelchair is set  _76 necessary for alternate controls    _77 Digital interface electronics _78 Allows w/c to operate when using alternative drive controls  <TFTDDUKGURKYHCWC>_3<\/JSEGBTDVVOHYWVPX>_10 ASL Head Array _80 Allows client to operate wheelchair  through switches placed in tri-panel headrest  _81 Sip and puff with tubing kit _82 needed to operate sip and puff drive controls  <GYIRSWNIOEVOJJKK>_9<\/FGHWEXHBZJIRCVEL>_38 Upgraded tracking electronics _84 increase safety when driving <BOFBPZWCHENIDPOE>_4<\/MPNTIRWERXVQMGQQ>_76 correct tracking when on uneven surfaces  _86 [facility] for switches or joystick _87 Attaches switches to w/c  _88 Swing away for access or transfers _89 midline for optimal placement _90 provides for consistent access  _91 Attendant controlled joystick plus mount _92 safety _93 long distance driving <PPJKDTOIZTIWPYKD>_9<\/IPJASNKNLZJQBHAL>_93 operation of seat functions _95 compliance with transportation regulations _96      Rear wheel placement/Axle adjustability _97 None _98 semi adjustable _99 fully adjustable  _100 improved UE access to wheels _101 improved stability _102 changing angle in space for improvement of postural stability _103 1-arm drive access <XTKWIOXBDZHGDJME>_2<\/ASTMHDQQIWLNLGXQ>_119 amputee pad placement _105    Wheel rims/ hand rims  _106 metal  _107 plastic coated _108 oblique projections _109 vertical projections _110 Provide ability to propel manual wheelchair  _111  Increase self-propulsion with hand weakness/decreased grasp  Push handles _112 extended  _113 angle adjustable  _114 standard _115 caregiver access _116 caregiver assist _117 allows "hooking" to enable increased ability to perform ADLs or maintain balance  One armed device  _118 Lt   _119 Rt _120 enable propulsion of manual wheelchair with one arm   _121      Brake/wheel lock extension _122  Lt   _123  Rt _124 increase indep in applying wheel locks   _125 Side guards _126 prevent clothing getting caught in wheel or becoming soiled _127  prevent skin  tears/abrasions  Battery: U1x2 _0 to power wheelchair   Other:     The above equipment has a life- long use expectancy. Growth and changes in medical and/or functional conditions would be the exceptions. This is to certify that the  therapist has no financial relationship with durable medical provider or manufacturer. The therapist will not receive remuneration of any kind for the equipment recommended in this evaluation.   Patient has mobility limitation that significantly impairs safe, timely participation in one or more mobility related ADL's.  (bathing, toileting, feeding, dressing, grooming, moving from room to room)                                                             _1  Yes _2  No Will mobility device sufficiently improve ability to participate and/or be aided in participation of MRADL's?         _3  Yes _4  No Can limitation be compensated for with use of a cane or walker?                                                                                _5  Yes _6  No Does patient or caregiver demonstrate ability/potential ability & willingness to safely use the mobility device?   _7  Yes _8  No Does patient's home environment support use of recommended mobility device?                                                    _9  Yes _10  No Does patient have sufficient upper extremity function necessary to functionally propel a manual wheelchair?    _11  Yes _12  No Does patient have sufficient strength and trunk stability to safely operate a POV (scooter)?                                  _13  Yes _14  No Does patient need additional features/benefits provided by a power wheelchair for MRADL's in the home?       _15  Yes _16  No Does the patient demonstrate the ability to safely use a power wheelchair?                                                              _17  Yes _18  No  Therapist Name Printed: Flonnie Hailstone Date: 03/15/22  Therapist's Signature:   Date:   Supplier's Name Printed:  Date:   Supplier's Signature:   Date:  Patient/Caregiver Signature:   Date:     This is to certify that I have read this evaluation and do agree with the content within:  Physician's Name Printed:   60 Signature:  Date:     This is  to certify that I, the above signed therapist have the following affiliations: _0  This DME provider _1  Manufacturer of recommended equipment _2  Patient's long term care facility _3  None of the above       [employer], OT 03/15/2022, 5:17 PM

## 2022-05-08 ENCOUNTER — Other Ambulatory Visit (HOSPITAL_COMMUNITY): Payer: Self-pay | Admitting: Family Medicine

## 2022-05-08 ENCOUNTER — Other Ambulatory Visit: Payer: Self-pay | Admitting: Family Medicine

## 2022-05-08 DIAGNOSIS — Z720 Tobacco use: Secondary | ICD-10-CM

## 2022-05-08 DIAGNOSIS — F17200 Nicotine dependence, unspecified, uncomplicated: Secondary | ICD-10-CM

## 2022-05-11 ENCOUNTER — Other Ambulatory Visit (HOSPITAL_COMMUNITY): Payer: Self-pay | Admitting: Family Medicine

## 2022-05-11 DIAGNOSIS — M2550 Pain in unspecified joint: Secondary | ICD-10-CM

## 2022-05-11 DIAGNOSIS — Z13820 Encounter for screening for osteoporosis: Secondary | ICD-10-CM

## 2022-05-11 DIAGNOSIS — M629 Disorder of muscle, unspecified: Secondary | ICD-10-CM

## 2022-05-23 ENCOUNTER — Ambulatory Visit (HOSPITAL_COMMUNITY)
Admission: RE | Admit: 2022-05-23 | Discharge: 2022-05-23 | Disposition: A | Discharge status: Home / Self Care | Payer: Medicare HMO | Source: Ambulatory Visit | Attending: Family Medicine | Admitting: Family Medicine

## 2022-05-23 DIAGNOSIS — M2550 Pain in unspecified joint: Secondary | ICD-10-CM | POA: Diagnosis present

## 2022-05-23 DIAGNOSIS — M629 Disorder of muscle, unspecified: Secondary | ICD-10-CM

## 2022-05-24 ENCOUNTER — Ambulatory Visit (HOSPITAL_COMMUNITY)
Admission: RE | Admit: 2022-05-24 | Discharge: 2022-05-24 | Disposition: A | Discharge status: Home / Self Care | Payer: Medicare HMO | Source: Ambulatory Visit | Attending: Family Medicine | Admitting: Family Medicine

## 2022-05-24 DIAGNOSIS — M8589 Other specified disorders of bone density and structure, multiple sites: Secondary | ICD-10-CM | POA: Diagnosis not present

## 2022-05-24 DIAGNOSIS — F17200 Nicotine dependence, unspecified, uncomplicated: Secondary | ICD-10-CM | POA: Insufficient documentation

## 2022-05-24 DIAGNOSIS — Z13820 Encounter for screening for osteoporosis: Secondary | ICD-10-CM | POA: Diagnosis present

## 2022-05-24 DIAGNOSIS — Z780 Asymptomatic menopausal state: Secondary | ICD-10-CM | POA: Insufficient documentation

## 2022-06-06 ENCOUNTER — Other Ambulatory Visit (HOSPITAL_COMMUNITY): Payer: Self-pay | Admitting: Family Medicine

## 2022-06-06 DIAGNOSIS — M71452 Calcium deposit in bursa, left hip: Secondary | ICD-10-CM

## 2022-06-06 DIAGNOSIS — M65251 Calcific tendinitis, right thigh: Secondary | ICD-10-CM

## 2022-06-16 ENCOUNTER — Inpatient Hospital Stay: Payer: Medicare HMO | Attending: Hematology | Admitting: Hematology

## 2022-06-16 ENCOUNTER — Inpatient Hospital Stay: Payer: Medicare HMO

## 2022-06-16 VITALS — BP 186/84 | HR 77 | Temp 98.0°F | Resp 18 | Ht 62.5 in | Wt 203.3 lb

## 2022-06-16 DIAGNOSIS — Z79899 Other long term (current) drug therapy: Secondary | ICD-10-CM | POA: Diagnosis not present

## 2022-06-16 DIAGNOSIS — R778 Other specified abnormalities of plasma proteins: Secondary | ICD-10-CM | POA: Insufficient documentation

## 2022-06-16 DIAGNOSIS — Z89612 Acquired absence of left leg above knee: Secondary | ICD-10-CM | POA: Diagnosis not present

## 2022-06-16 DIAGNOSIS — Z122 Encounter for screening for malignant neoplasm of respiratory organs: Secondary | ICD-10-CM

## 2022-06-16 DIAGNOSIS — F17210 Nicotine dependence, cigarettes, uncomplicated: Secondary | ICD-10-CM | POA: Diagnosis not present

## 2022-06-16 DIAGNOSIS — Z803 Family history of malignant neoplasm of breast: Secondary | ICD-10-CM | POA: Diagnosis not present

## 2022-06-16 DIAGNOSIS — Z87891 Personal history of nicotine dependence: Secondary | ICD-10-CM

## 2022-06-16 NOTE — Patient Instructions (Addendum)
[facility]  Discharge Instructions  You were seen and examined today by Dr. Delton Coombes. Dr. Delton Coombes is a hematologist, meaning that he specializes in blood abnormalities. Dr. Delton Coombes discussed your past medical history, family history of cancers/blood conditions and the events that led to you being here today.  You were referred to Dr. 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 This condition requires monitoring but often does not require any medications.  Dr. Delton Coombes has recommended additional labs today to recheck for the protein for clarity purposes. If it is present, we will then discuss further work-up.  Please have a Lung Cancer Screening CT scan done also.  Follow-up as scheduled.  Thank you for choosing [facility] to provide your oncology and hematology care.   To afford each patient quality time with our provider, please arrive at least 15 minutes before your scheduled appointment time. You may need to reschedule your appointment if you arrive late (10 or more minutes). Arriving late affects you and other patients whose appointments are after yours.  Also, if you miss three or more appointments without notifying the office, you may be dismissed from the clinic at the provider's discretion.    Again, thank you for choosing [facility].  Our hope is that these requests will decrease the amount of time that you wait before being seen by our physicians.   If you have a lab appointment with the [facility] please come in thru the Main Entrance and check in at the main information desk.           _____________________________________________________________  Should you have questions after your visit to [facility], please contact our office at  [phone] and follow the prompts.  Our office hours are 8:00 a.m. to 4:30 p.m. Monday - Thursday and 8:00 a.m. to 2:30 p.m. Friday.  Please note that voicemails left after 4:00 p.m. may not be returned until the following business day.  We are closed weekends and all major holidays.  You do have access to a nurse 24-7, just call the main number to the clinic [phone] and do not press any options, hold on the line and a nurse will answer the phone.    For prescription refill requests, have your pharmacy contact our office and allow 72 hours.    Masks are optional in the cancer centers. If you would like for your care team to wear a mask while they are taking care of you, please let them know. You may have one support person who is at least 65 years old accompany you for your appointments.

## 2022-06-16 NOTE — Progress Notes (Signed)
CONSULT NOTE  Patient Care Team: Parthenia Ames, NP as PCP - General (Family Medicine) Eloise Harman, DO as Consulting Physician (Gastroenterology) Derek Jack, MD as Medical Oncologist (Hematology)  CHIEF COMPLAINTS/PURPOSE OF CONSULTATION:  Abnormal SPEP.  HISTORY OF PRESENTING ILLNESS:  Natalie [name] 66 y.o. female is seen in consultation today at the request of Dr. Theador Hawthorne for further work-up and management of abnormal SPEP.  Recent labs at Dr. Toya Smothers office of SPEP showed faint band.  Immunofixation was normal.  Free light chain ratio was elevated with elevated kappa and lambda light chains.  Beta-2 microglobulin was elevated.  Creatinine was 1.81 with calcium 10 and albumin 4.  Hemoglobin was normal.  She denies any new onset bone pains.  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  She is a current active smoker, smokes 5 cigarettes/day.  She had smoked 1 pack/day for more than 25 to 30 years in the past.  Denies any recurrent infections.  MEDICAL HISTORY:  Past Medical History:  Diagnosis Date   Cellulitis    Hypercholesterolemia    Hypertension    Sciatica     SURGICAL HISTORY: Past Surgical History:  Procedure Laterality Date   above the knee amputation     COLONOSCOPY WITH PROPOFOL N/A 10/01/2015   Procedure: COLONOSCOPY WITH PROPOFOL;  Surgeon: [name], MD;  Location: [facility] ENDOSCOPY;  Service: Endoscopy;  Laterality: N/A;   COLONOSCOPY WITH PROPOFOL N/A 06/13/2021   Procedure: COLONOSCOPY WITH PROPOFOL;  Surgeon: Eloise Harman, DO;  Location: [facility] ENDO SUITE;  Service: Endoscopy;  Laterality: N/A;  10:00 / ASA III   POLYPECTOMY  06/13/2021   Procedure: POLYPECTOMY;  Surgeon: Eloise Harman, DO;  Location: [facility] ENDO SUITE;  Service: Endoscopy;;    SOCIAL HISTORY: Social History   Socioeconomic History   Marital status: Married    Spouse name: Not on file   Number of children: Not on file   Years of education: Not on file    Highest education level: Not on file  Occupational History   Not on file  Tobacco Use   Smoking status: Some Days    Types: Cigarettes   Smokeless tobacco: Never  Substance and Sexual Activity   Alcohol use: No   Drug use: No   Sexual activity: Not on file  Other Topics Concern   Not on file  Social History Narrative   Not on file   Social Determinants of Health   Financial Resource Strain: Not on file  Food Insecurity: Not on file  Transportation Needs: Not on file  Physical Activity: Not on file  Stress: Not on file  Social Connections: Not on file  Intimate Partner Violence: Not on file    FAMILY HISTORY: No family history on file.  ALLERGIES:  has No Known Allergies.  MEDICATIONS:  Current Outpatient Medications  Medication Sig Dispense Refill   carvedilol (COREG) 3.125 MG tablet Take by mouth.     cyclobenzaprine (FLEXERIL) 10 MG tablet      naproxen (NAPROSYN) 500 MG tablet 1 tablet with food or milk as needed Orally every 12 hrs for 10 days     allopurinol (ZYLOPRIM) 300 MG tablet 1 tablet Orally Once a day for 30 day(s)     amLODipine (NORVASC) 10 MG tablet Take 10 mg by mouth daily. Reported on 10/01/2015     aspirin 81 MG chewable tablet Chew by mouth daily at 6 (six) AM.     atorvastatin (LIPITOR) 40  MG tablet Take 40 mg by mouth daily.     cholecalciferol (VITAMIN D3) 25 MCG (1000 UNIT) tablet Take 1 tablet by mouth daily.     cinacalcet (SENSIPAR) 30 MG tablet Take 30 mg by mouth daily.     hydrALAZINE (APRESOLINE) 100 MG tablet Take 100 mg by mouth 2 (two) times daily.     losartan (COZAAR) 50 MG tablet Take by mouth.     omeprazole (PRILOSEC) 10 MG capsule Take 10 mg by mouth daily.     predniSONE (DELTASONE) 10 MG tablet Take 2 tablets (20 mg total) by mouth daily. 14 tablet 0   traZODone (DESYREL) 150 MG tablet Take 150 mg by mouth at bedtime. Reported on 10/01/2015     No current facility-administered medications for this visit.    REVIEW OF  SYSTEMS:   Constitutional: Denies fevers, chills or abnormal night sweats Eyes: Denies blurriness of vision, double vision or watery eyes Ears, nose, mouth, throat, and face: Denies mucositis or sore throat Respiratory: Denies cough, dyspnea or wheezes Cardiovascular: Denies palpitation, chest discomfort or lower extremity swelling Gastrointestinal:  Denies nausea, heartburn or change in bowel habits Skin: Denies abnormal skin rashes Lymphatics: Denies new lymphadenopathy or easy bruising Neurological:Denies numbness, tingling or new weaknesses Behavioral/Psych: Mood is stable, no new changes  All other systems were reviewed with the patient and are negative.  PHYSICAL EXAMINATION: ECOG PERFORMANCE STATUS: 1 - Symptomatic but completely ambulatory  Vitals:   06/16/22 1114  BP: (!) 186/84  Pulse: 77  Resp: 18  Temp: 98 F (36.7 C)  SpO2: 94%   Filed Weights   06/16/22 1114  Weight: 203 lb 4.8 oz (92.2 kg)    GENERAL:alert, no distress and comfortable SKIN: skin color, texture, turgor are normal, no rashes or significant lesions EYES: normal, conjunctiva are pink and non-injected, sclera clear OROPHARYNX:no exudate, no erythema and lips, buccal mucosa, and tongue normal  NECK: supple, thyroid normal size, non-tender, without nodularity LYMPH:  no palpable lymphadenopathy in the cervical, axillary or inguinal LUNGS: clear to auscultation and percussion with normal breathing effort HEART: regular rate & rhythm and no murmurs and no lower extremity edema ABDOMEN:abdomen soft, non-tender and normal bowel sounds Musculoskeletal:no cyanosis of digits and no clubbing  PSYCH: alert & oriented x 3 with fluent speech NEURO: no focal motor/sensory deficits  LABORATORY DATA:  I have reviewed the data as listed No results found for this or any previous visit (from the past 2160 hour(s)).  RADIOGRAPHIC STUDIES: I have personally reviewed the radiological images as listed and agreed  with the findings in the report. [place] RT LOWER EXTREM LTD SOFT TISSUE NON VASCULAR  Result Date: 05/24/2022 CLINICAL DATA:  Multiple joint pain, muscle nodule EXAM: ULTRASOUND right and left LOWER EXTREMITY LIMITED TECHNIQUE: Ultrasound examination of the lower extremity soft tissues was performed in the area of clinical concern. COMPARISON:  None Available. FINDINGS: Targeted sonography of both lower extremities is performed in the region of palpable mass. Right lower extremity: Targeted ultrasound of the right lateral hip/proximal thigh is performed. In the region of palpable mass within the subcutaneous soft tissues, there is curvilinear echogenicity with dense posterior acoustical shadowing suggesting calcific deposit. The largest measures 3.3 cm. Left lower extremity: Targeted ultrasound of the left hip/proximal thigh is performed. In the region of palpable mass in the subcutaneous soft tissues is a heterogeneous hyper and hypoechoic mass with internal fluid areas measuring 5.1 by 6.5 x 3.6 cm. IMPRESSION: 1. Echogenic 3.3 cm mass with  dense shadowing in the right lateral hip/proximal thigh corresponding to palpable mass, the appearance suggests calcified mass. Radiographic and potential MRI correlation is recommended 2. Large heterogeneous hypo and hyperechoic mass within the left hip/proximal thigh measuring 6.5 cm with areas of fluid echogenicity. This is indeterminate in appearance. Recommend further evaluation with radiograph and MRI. These results will be called to the ordering clinician or representative by the Radiologist Assistant, and communication documented in the PACS or [employer]. Electronically Signed   By: Donavan Foil M.D.   On: 05/24/2022 20:36   [place] LT LOWER EXTREM LTD SOFT TISSUE NON VASCULAR  Result Date: 05/24/2022 CLINICAL DATA:  Multiple joint pain, muscle nodule EXAM: ULTRASOUND right and left LOWER EXTREMITY LIMITED TECHNIQUE: Ultrasound examination of the lower extremity  soft tissues was performed in the area of clinical concern. COMPARISON:  None Available. FINDINGS: Targeted sonography of both lower extremities is performed in the region of palpable mass. Right lower extremity: Targeted ultrasound of the right lateral hip/proximal thigh is performed. In the region of palpable mass within the subcutaneous soft tissues, there is curvilinear echogenicity with dense posterior acoustical shadowing suggesting calcific deposit. The largest measures 3.3 cm. Left lower extremity: Targeted ultrasound of the left hip/proximal thigh is performed. In the region of palpable mass in the subcutaneous soft tissues is a heterogeneous hyper and hypoechoic mass with internal fluid areas measuring 5.1 by 6.5 x 3.6 cm. IMPRESSION: 1. Echogenic 3.3 cm mass with dense shadowing in the right lateral hip/proximal thigh corresponding to palpable mass, the appearance suggests calcified mass. Radiographic and potential MRI correlation is recommended 2. Large heterogeneous hypo and hyperechoic mass within the left hip/proximal thigh measuring 6.5 cm with areas of fluid echogenicity. This is indeterminate in appearance. Recommend further evaluation with radiograph and MRI. These results will be called to the ordering clinician or representative by the Radiologist Assistant, and communication documented in the PACS or [employer]. Electronically Signed   By: Donavan Foil M.D.   On: 05/24/2022 20:36   DG BONE DENSITY (DXA)  Result Date: 05/24/2022 EXAM: DUAL X-RAY ABSORPTIOMETRY (DXA) FOR BONE MINERAL DENSITY IMPRESSION: Your patient Natalie [name] completed a BMD test on 05/24/2022 using the [place] (software version: 14.10) manufactured by [employer]. The following summarizes the results of our evaluation. Technologist: [facility] PATIENT BIOGRAPHICAL: Name: Natalie [name], Natalie [name] Patient ID: [id number] Birth Date: 1955/11/07 Height: 62.0 in. Gender: Female Exam Date: 05/24/2022  Weight: 217.0 lbs. Indications: Low Calcium Intake, Post Menopausal, Renal, Tobacco User, Tobacco User (Current Smoker), Secondary Osteoporosis Fractures: Treatments: Asprin, Vitamin D DENSITOMETRY RESULTS: Site         Region     Measured Date Measured Age WHO Classification Young Adult T-score BMD         %Change vs. Previous Significant Change (*) [facility] Spine L2-L4 05/24/2022 66.0 Osteopenia -1.8 0.979 g/cm2 - - Right Femur Neck 05/24/2022 65.0 Osteopenia -2.3 0.722 g/cm2 - - Right Femur Total 05/24/2022 65.0 Osteopenia -1.7 0.789 g/cm2 - - Left Forearm Radius 33% 05/24/2022 65.0 Normal 0.7 0.763 g/cm2 - - ASSESSMENT: The BMD measured at Femur Neck is 0.722 g/cm2 with a T-score of -2.3. This patient is considered osteopenic according to [facility]) criteria. The scan quality is good. L1 was excluded due to advanced degenerative changes. Lt hip was excluded due to above the knee amputation. World Health Organization [facility] criteria for post-menopausal, Caucasian Women: Normal:       T-score at or above -1  SD Osteopenia:   T-score between -1 and -2.5 SD Osteoporosis: T-score at or below -2.5 SD RECOMMENDATIONS: 1. All patients should optimize calcium and vitamin D intake. 2. Consider FDA-approved medical therapies in postmenopausal women and med aged 15 years and older, based on the following: a. A hip or vertebral (clinical or morphometric) fracture b. T-score< -2.5 at the femoral neck or spine after appropriate evaluation to exclude secondary causes c. Low bone mass (T-score between -1.0 and -2.5 at the femoral neck or spine) and a 10-year probability of a hip fracture > 3% or a 10-year probability of a major osteoporosis-related fracture > 20% based on the [place]-adapted WHO algorithm d. Clinician judgment and/or patient preferences may indicate treatment for people with 10-year fracture probabilities above or below these levels FOLLOW-UP: Patients with diagnosis of osteoporsis or at high risk for  fracture should have regular bone mineral density tests. For patients eligible for Medicare, routine testing is allowed once every 2 years. The testing frequency can be increased to one year for patients who have rapidly progressing disease, those who are receiving or discontinuing medical therapy to restore bone mass, or have additional risk factors. I have reviewed this report, and agree with the above findings. [facility] Your patient Natalie [name] completed a FRAX assessment on 05/24/2022 using the [employer] (analysis version: 14.10) manufactured by [employer]. The following summarizes the results of our evaluation. PATIENT BIOGRAPHICAL: Name: Natalie [name], Natalie [name] Patient ID: [id number] Birth Date: Mar 24, 1956 Height:    62.0 in. Gender:     Female    [age]       Weight:    217.0 lbs. Ethnicity:  Black                            Exam Date: 05/24/2022 FRAX* RESULTS:  (version: 3.5) 10-year Probability of Fracture1 Major Osteoporotic Fracture2 Hip Fracture 5.0% 1.3% Population: [place] (Black) Risk Factors: Tobacco User (Current Smoker), Secondary Osteoporosis Based on Femur (Right) Neck BMD 1 -The 10-year probability of fracture may be lower than reported if the patient has received treatment. 2 -Major Osteoporotic Fracture: Clinical Spine, Forearm, Hip or Shoulder *FRAX is a Materials engineer of the [employer] of [employer] for Metabolic Bone Disease, a [place] (WHO) [facility]. ASSESSMENT: The probability of a major osteoporotic fracture is 5.0% within the next ten years. The probability of a hip fracture is 1.3% within the next ten years. Electronically Signed   By: Zerita Boers M.D.   On: 05/24/2022 10:41    ASSESSMENT:  1.  Abnormal SPEP: - Patient seen at the request of Dr. Theador Hawthorne as her SPEP showed a faint band. - Serum immunofixation was negative.  Kappa light chains were 54.5, lambda light chains 32.7 and ratio of  1.67.  UPEP was negative. - Labs (05/26/2022): Hb-13.5, creatinine 1.81, calcium 10.0, albumin 4 - She does not report any new bone pains.  No tingling or numbness in extremities. - Beta-2 microglobulin elevated at 4.47.  2.  Social/family history: -She lives with her husband at home.  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  She is independent of ADLs and IADLs including driving.  She retired working at [employer].  She also worked at then [employer].  She is a current active smoker, 5 cigarettes/day.  However prior to that she smoked about  1 pack/day for 25 to 30 years. - No family history of myeloma.  Sister had breast cancer.  Mother had breast cancer.  Father had brain tumor.   PLAN:  1.  Abnormal SPEP: - We discussed the spectrum of plasma cell disorders. - Recommend repeating SPEP. - Beta-2 microglobulin is elevated due to CKD. - If SPEP is negative, will repeat in 6 to 12 months to make sure it is staying stable.  2.  Smoking history: - We talked about annual lung cancer screening CT scan and its impact on improving early detection and overall survival. - She is agreeable.  We will order it prior to next visit.    All questions were answered. The patient knows to call the clinic with any problems, questions or concerns.      Derek Jack, MD 06/16/22 1:52 PM

## 2022-06-20 LAB — PROTEIN ELECTROPHORESIS, SERUM
A/G Ratio: 1.4 (ref 0.7–1.7)
Albumin ELP: 3.6 g/dL (ref 2.9–4.4)
Alpha-1-Globulin: 0.2 g/dL (ref 0.0–0.4)
Alpha-2-Globulin: 0.5 g/dL (ref 0.4–1.0)
Beta Globulin: 0.9 g/dL (ref 0.7–1.3)
Gamma Globulin: 0.9 g/dL (ref 0.4–1.8)
Globulin, Total: 2.6 g/dL (ref 2.2–3.9)
Total Protein ELP: 6.2 g/dL (ref 6.0–8.5)

## 2022-07-05 ENCOUNTER — Ambulatory Visit (HOSPITAL_COMMUNITY): Payer: Medicare HMO

## 2022-07-07 ENCOUNTER — Ambulatory Visit: Payer: Medicare HMO | Admitting: Physician Assistant

## 2022-07-12 ENCOUNTER — Ambulatory Visit: Payer: Medicare HMO | Admitting: Physician Assistant

## 2022-07-26 ENCOUNTER — Ambulatory Visit (HOSPITAL_COMMUNITY)
Admission: RE | Admit: 2022-07-26 | Discharge: 2022-07-26 | Disposition: A | Discharge status: Home / Self Care | Payer: Medicare HMO | Source: Ambulatory Visit | Attending: Hematology | Admitting: Hematology

## 2022-07-26 DIAGNOSIS — Z122 Encounter for screening for malignant neoplasm of respiratory organs: Secondary | ICD-10-CM | POA: Diagnosis present

## 2022-07-26 DIAGNOSIS — Z87891 Personal history of nicotine dependence: Secondary | ICD-10-CM | POA: Insufficient documentation

## 2022-08-08 NOTE — Progress Notes (Signed)
[facility] [address] [place] [address]   CLINIC:  Medical Oncology/Hematology  PCP:  Parthenia Ames, NP [place] [address] [place] [address] [phone]   REASON FOR VISIT:  Follow-up for abnormal SPEP  PRIOR THERAPY: None  CURRENT THERAPY: Under workup  INTERVAL HISTORY:  [name] 67 y.o. female returns for routine follow-up of her abnormal SPEP.  She was seen for initial consultation by Dr. Delton Coombes on 06/16/2022.  At today's visit, she reports feeling fairly well.  No recent hospitalizations, surgeries, or changes in baseline health status.  She denies any new bone pain or recent fractures.  She denies any B symptoms such as fever, chills, night sweats, unintentional weight loss..   No new neurologic symptoms such as tinnitus, new-onset hearing loss, blurred vision, headache, or dizziness.  Denies any numbness or tingling in hands or feet.  No new masses or lymphadenopathy per her report.  She has 100% energy and 100% appetite. She endorses that she is maintaining a stable weight.   REVIEW OF SYSTEMS: No complaints at today's visit. Review of Systems  Constitutional:  Negative for appetite change, chills, diaphoresis, fatigue, fever and unexpected weight change.  HENT:   Negative for lump/mass and nosebleeds.   Eyes:  Negative for eye problems.  Respiratory:  Negative for cough, hemoptysis and shortness of breath.   Cardiovascular:  Negative for chest pain, leg swelling and palpitations.  Gastrointestinal:  Negative for abdominal pain, blood in stool, constipation, diarrhea, nausea and vomiting.  Genitourinary:  Negative for hematuria.   Skin: Negative.   Neurological:  Negative for dizziness, headaches and light-headedness.  Hematological:  Does not bruise/bleed easily.      PAST MEDICAL/SURGICAL HISTORY:  Past Medical History:  Diagnosis Date   Cellulitis    Hypercholesterolemia    Hypertension    Sciatica     Past Surgical History:  Procedure Laterality Date   above the knee amputation     COLONOSCOPY WITH PROPOFOL N/A 10/01/2015   Procedure: COLONOSCOPY WITH PROPOFOL;  Surgeon: [name], MD;  Location: [facility] ENDOSCOPY;  Service: Endoscopy;  Laterality: N/A;   COLONOSCOPY WITH PROPOFOL N/A 06/13/2021   Procedure: COLONOSCOPY WITH PROPOFOL;  Surgeon: Eloise Harman, DO;  Location: [facility] ENDO SUITE;  Service: Endoscopy;  Laterality: N/A;  10:00 / ASA III   POLYPECTOMY  06/13/2021   Procedure: POLYPECTOMY;  Surgeon: Eloise Harman, DO;  Location: [facility] ENDO SUITE;  Service: Endoscopy;;     SOCIAL HISTORY:  Social History   Socioeconomic History   Marital status: Married    Spouse name: Not on file   Number of children: Not on file   Years of education: Not on file   Highest education level: Not on file  Occupational History   Not on file  Tobacco Use   Smoking status: Some Days    Types: Cigarettes   Smokeless tobacco: Never  Substance and Sexual Activity   Alcohol use: No   Drug use: No   Sexual activity: Not on file  Other Topics Concern   Not on file  Social History Narrative   Not on file   Social Determinants of Health   Financial Resource Strain: Not on file  Food Insecurity: Not on file  Transportation Needs: Not on file  Physical Activity: Not on file  Stress: Not on file  Social Connections: Not on file  Intimate Partner Violence: Not on file    FAMILY HISTORY:  No family history on file.  CURRENT MEDICATIONS:  Outpatient Encounter Medications as of 08/09/2022  Medication Sig   allopurinol (ZYLOPRIM) 300 MG tablet 1 tablet Orally Once a day for 30 day(s)   amLODipine (NORVASC) 10 MG tablet Take 10 mg by mouth daily. Reported on 10/01/2015   aspirin 81 MG chewable tablet Chew by mouth daily at 6 (six) AM.   atorvastatin (LIPITOR) 40 MG tablet Take 40 mg by mouth daily.   carvedilol (COREG) 3.125 MG tablet Take by mouth.   cholecalciferol (VITAMIN D3) 25  MCG (1000 UNIT) tablet Take 1 tablet by mouth daily.   cinacalcet (SENSIPAR) 30 MG tablet Take 30 mg by mouth daily.   cyclobenzaprine (FLEXERIL) 10 MG tablet    hydrALAZINE (APRESOLINE) 100 MG tablet Take 100 mg by mouth 2 (two) times daily.   losartan (COZAAR) 50 MG tablet Take by mouth.   naproxen (NAPROSYN) 500 MG tablet 1 tablet with food or milk as needed Orally every 12 hrs for 10 days   omeprazole (PRILOSEC) 10 MG capsule Take 10 mg by mouth daily.   predniSONE (DELTASONE) 10 MG tablet Take 2 tablets (20 mg total) by mouth daily.   traZODone (DESYREL) 150 MG tablet Take 150 mg by mouth at bedtime. Reported on 10/01/2015   No facility-administered encounter medications on file as of 08/09/2022.    ALLERGIES:  No Known Allergies   PHYSICAL EXAM:  ECOG PERFORMANCE STATUS: 2 - Symptomatic, <50% confined to bed  There were no vitals filed for this visit. There were no vitals filed for this visit. Physical Exam Constitutional:      Appearance: Normal appearance. She is obese.     Comments: Presents in wheelchair  HENT:     Head: Normocephalic and atraumatic.     Mouth/Throat:     Mouth: Mucous membranes are moist.  Eyes:     Extraocular Movements: Extraocular movements intact.     Pupils: Pupils are equal, round, and reactive to light.  Cardiovascular:     Rate and Rhythm: Normal rate and regular rhythm.     Pulses: Normal pulses.     Heart sounds: Normal heart sounds.  Pulmonary:     Effort: Pulmonary effort is normal.     Breath sounds: Normal breath sounds.  Abdominal:     General: Bowel sounds are normal.     Palpations: Abdomen is soft.     Tenderness: There is no abdominal tenderness.  Musculoskeletal:        General: No swelling.     Right lower leg: No edema.     Left lower leg: No edema.  Lymphadenopathy:     Cervical: No cervical adenopathy.  Skin:    General: Skin is warm and dry.  Neurological:     General: No focal deficit present.     Mental Status:  She is alert and oriented to person, place, and time.  Psychiatric:        Mood and Affect: Mood normal.        Behavior: Behavior normal.      LABORATORY DATA:  I have reviewed the labs as listed.  CBC    Component Value Date/Time   WBC 11.3 (H) 11/12/2021 1553   RBC 5.26 (H) 11/12/2021 1553   HGB 14.3 11/12/2021 1553   HGB 7.5 (L) 05/25/2014 0411   HCT 44.3 11/12/2021 1553   HCT 23.8 (L) 05/25/2014 0411   PLT 341 11/12/2021 1553   PLT 769 (H) 05/25/2014 0411  MCV 84.2 11/12/2021 1553   MCV 79 (L) 05/25/2014 0411   MCH 27.2 11/12/2021 1553   MCHC 32.3 11/12/2021 1553   RDW 16.1 (H) 11/12/2021 1553   RDW 18.3 (H) 05/25/2014 0411   LYMPHSABS 1.3 11/12/2021 1553   LYMPHSABS 2.9 05/25/2014 0411   MONOABS 0.5 11/12/2021 1553   MONOABS 0.9 05/25/2014 0411   EOSABS 0.1 11/12/2021 1553   EOSABS 0.1 05/25/2014 0411   BASOSABS 0.1 11/12/2021 1553   BASOSABS 0.1 05/25/2014 0411      Latest Ref Rng & Units 11/12/2021    3:53 PM 05/26/2021   10:20 AM 05/27/2014    4:21 AM  CMP  Glucose 70 - 99 mg/dL 107  98    BUN 8 - 23 mg/dL 25  20    Creatinine 0.44 - 1.00 mg/dL 2.20  1.90  0.86   Sodium 135 - 145 mmol/L 140  139    Potassium 3.5 - 5.1 mmol/L 3.6  4.3    Chloride 98 - 111 mmol/L 105  104    CO2 22 - 32 mmol/L 26  30    Calcium 8.9 - 10.3 mg/dL 9.9  9.6    Total Protein 6.5 - 8.1 g/dL 7.5     Total Bilirubin 0.3 - 1.2 mg/dL 0.6     Alkaline Phos 38 - 126 U/L 130     AST 15 - 41 U/L 14     ALT 0 - 44 U/L 10       DIAGNOSTIC IMAGING:  I have independently reviewed the relevant imaging and discussed with the patient.  ASSESSMENT & PLAN: 1.  Abnormal SPEP: - Patient seen at the request of Dr. Theador Hawthorne as her SPEP showed a faint band. - Serum immunofixation was negative.  Kappa light chains were 54.5, lambda light chains 32.7 and ratio of 1.67.  UPEP was negative. - Labs (05/26/2022): Hb 13.5, creatinine 1.81, calcium 10.0, albumin 4 - Beta-2 microglobulin elevated at  4.47, secondary to CKD - She does not report any new bone pains.  No tingling or numbness in extremities. - Repeat SPEP (06/16/2022): Normal, no evidence of monoclonal protein - We have discussed the spectrum of plasma cell disorders.   - PLAN: No evidence of plasma cell disorder at this time.  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  2.  Tobacco use - She is a current active smoker, 5 cigarettes/day. However prior to that she smoked about 1 pack/day for 40+ years (since age 64) - She was agreeable to annual lung cancer screening CT scan following discussion of its impact on improving early detection and overall survival. - LDCT chest (07/26/2022): Lung RADS 3S, probably benign findings (new groundglass nodule of upper lobe measuring 11.6 mm and new solid pulmonary nodule of right upper lobe measuring 4.1 mm and solid pulmonary nodule measuring 3.9 mm seen in right lower lobe), with recommendation for short-term follow-up in 6 months - PLAN: Repeat low-dose CT chest in 6 months ("CT CHEST LCS NODULE FOLLOW-UP W/O CM") - Patient advised of additional findings (moderate coronary artery calcifications, aortic atherosclerosis, emphysema) and recommended to discuss further with her PCP  3.  Social/family history: -She lives with her husband at home.  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  She is independent of ADLs and IADLs including driving.  She retired working at [employer].  She also worked at then [employer].  She is a current  active smoker, 5 cigarettes/day.  However prior to that she smoked about 1 pack/day for 25 to 30 years. - No family history of myeloma.  Sister had breast cancer.  Mother had breast cancer.  Father had brain tumor.   PLAN SUMMARY: >> Labs in 6 months (CBC/D, CMP, SPEP, immunofixation, light chains, LDH) >> CT chest 6 months >> Office visit in 6 months, 1 week after labs and CT chest   All questions were  answered. The patient knows to call the clinic with any problems, questions or concerns.  Medical decision making: Low  Time spent on visit: I spent 15 minutes counseling the patient face to face. The total time spent in the appointment was 22 minutes and more than 50% was on counseling.   Harriett Rush, PA-C  08/09/2022 9:21 AM

## 2022-08-09 ENCOUNTER — Other Ambulatory Visit: Payer: Self-pay

## 2022-08-09 ENCOUNTER — Inpatient Hospital Stay: Payer: Medicare HMO | Attending: Hematology | Admitting: Physician Assistant

## 2022-08-09 VITALS — BP 152/81 | HR 58 | Temp 98.0°F | Resp 18 | Wt 201.7 lb

## 2022-08-09 DIAGNOSIS — Z79899 Other long term (current) drug therapy: Secondary | ICD-10-CM | POA: Insufficient documentation

## 2022-08-09 DIAGNOSIS — Z87891 Personal history of nicotine dependence: Secondary | ICD-10-CM

## 2022-08-09 DIAGNOSIS — R911 Solitary pulmonary nodule: Secondary | ICD-10-CM | POA: Diagnosis not present

## 2022-08-09 DIAGNOSIS — R778 Other specified abnormalities of plasma proteins: Secondary | ICD-10-CM

## 2022-08-09 DIAGNOSIS — D500 Iron deficiency anemia secondary to blood loss (chronic): Secondary | ICD-10-CM

## 2022-08-09 DIAGNOSIS — C9000 Multiple myeloma not having achieved remission: Secondary | ICD-10-CM

## 2022-08-09 DIAGNOSIS — Z993 Dependence on wheelchair: Secondary | ICD-10-CM | POA: Diagnosis not present

## 2022-08-09 DIAGNOSIS — D508 Other iron deficiency anemias: Secondary | ICD-10-CM

## 2022-08-09 NOTE — Patient Instructions (Signed)
[facility] [place] **   You were seen today by Tarri Abernethy PA-C for your follow-up visit.    ABNORMAL PROTEIN: You were initially sent to [place] by your kidney doctor due to an abnormal protein in your blood that can sometimes be precancerous (MGUS) and lead to a type of cancer called multiple myeloma.  When we rechecked your labs, we did NOT see any sign of this abnormal protein.  We will check these labs again in 6 months to make sure everything looks stable.  CT SCAN OF THE CHEST: The CT scan of your chest showed some pulmonary nodules (spots on your lungs).  These are most likely benign (noncancerous), but we will check them again in 6 months to make sure there are not any concerning changes. Your CT scan also showed findings consistent with COPD and plaque buildup in the arteries around your heart (places you at risk for heart disease or heart attack in the future).  You should discuss these findings with your primary care doctor.  FOLLOW-UP APPOINTMENT: Office visit in 6 months, 1 week after labs and CT scan.  ** Thank you for trusting me with your healthcare!  I strive to provide all of my patients with quality care at each visit.  If you receive a survey for this visit, I would be so grateful to you for taking the time to provide feedback.  Thank you in advance!  ~ Didier Brandenburg                   Dr. Derek Jack   &   Tarri Abernethy, PA-C   - - - - - - - - - - - - - - - - - -    Thank you for choosing [facility] at [facility] to provide your oncology and hematology care.  To afford each patient quality time with our provider, please arrive at least 15 minutes before your scheduled appointment time.   If you have a lab appointment with the [facility] please come in thru the Main Entrance and check in at the main information desk.  You need to re-schedule your appointment should  you arrive 10 or more minutes late.  We strive to give you quality time with our providers, and arriving late affects you and other patients whose appointments are after yours.  Also, if you no show three or more times for appointments you may be dismissed from the clinic at the providers discretion.     Again, thank you for choosing [facility].  Our hope is that these requests will decrease the amount of time that you wait before being seen by our physicians.       _____________________________________________________________  Should you have questions after your visit to [facility], please contact our office at [phone] and follow the prompts.  Our office hours are 8:00 a.m. and 4:30 p.m. Monday - Friday.  Please note that voicemails left after 4:00 p.m. may not be returned until the following business day.  We are closed weekends and major holidays.  You do have access to a nurse 24-7, just call the main number to the clinic [phone] and do not press any options, hold on the line and a nurse will answer the phone.    For prescription refill requests, have your pharmacy contact our office and allow 72 hours.

## 2022-12-02 ENCOUNTER — Emergency Department (HOSPITAL_COMMUNITY)
Admission: EM | Admit: 2022-12-02 | Discharge: 2022-12-02 | Disposition: A | Discharge status: Home / Self Care | Payer: Medicare HMO | Attending: Emergency Medicine | Admitting: Emergency Medicine

## 2022-12-02 ENCOUNTER — Encounter (HOSPITAL_COMMUNITY): Payer: Self-pay | Admitting: Emergency Medicine

## 2022-12-02 ENCOUNTER — Other Ambulatory Visit: Payer: Self-pay

## 2022-12-02 DIAGNOSIS — R21 Rash and other nonspecific skin eruption: Secondary | ICD-10-CM | POA: Diagnosis not present

## 2022-12-02 DIAGNOSIS — Z7982 Long term (current) use of aspirin: Secondary | ICD-10-CM | POA: Insufficient documentation

## 2022-12-02 MED ORDER — PREDNISONE 10 MG PO TABS: ORAL_TABLET | ORAL | 0 refills | Status: DC

## 2022-12-02 MED ORDER — VALACYCLOVIR HCL 1 G PO TABS: 1000.0000 mg | ORAL_TABLET | Freq: Three times a day (TID) | ORAL | 0 refills | Status: AC

## 2022-12-02 NOTE — Discharge Instructions (Addendum)
Complete the antibiotics you were prescribed by your MD.  Start taking the prednisone and the valcyclovir prescribed today - these medicines are covering you for the possibility of an allergic reaction and shingles/viral rash which can have this appearance as well.  You would benefit from seeing a dermatologist if these medicines do not resolve this rash.  Call Dr. Margo Aye for follow up if needed (he does have an office in [place] as well as [facility]).

## 2022-12-02 NOTE — ED Triage Notes (Signed)
Pt presents with blistery rash to both arms and face x 3 weeks. Pt denies any exposure to outdoor plants. No changes in foods, or lotions or detergents. Pt states she has been popping blisters and then new blister appears. Pt denies pain only itching.

## 2022-12-02 NOTE — ED Provider Notes (Signed)
[place] EMERGENCY DEPARTMENT AT [facility] Provider Note   CSN: [id number] Arrival date & time: 12/02/22  1014     History  Chief Complaint  Patient presents with   Rash    Natalie [name] is a 67 y.o. female.  The history is provided by the patient.  Rash Location:  Face and shoulder/arm Facial rash location:  Forehead, R eyelid and L eyelid Shoulder/arm rash location:  L wrist, R wrist, L hand and R hand Quality: blistering, itchiness and weeping   Quality: not painful   Severity:  Moderate Onset quality:  Gradual Duration:  3 weeks Progression:  Unchanged Chronicity:  New Context: medications   Context: not animal contact, not chemical exposure, not exposure to similar rash, not food and not new detergent/soap   Context comment:  Rash started within days of applying a nicotine patch.  she has dc'd this. Relieved by:  Nothing Worsened by:  Nothing Ineffective treatments: Pt was placed on mupirocin ointment and keflex by pcp 3 days ago with no improvement yet. Associated symptoms: no abdominal pain, no fever, no hoarse voice, no nausea, no shortness of breath, no sore throat, no throat swelling, no tongue swelling, not vomiting and not wheezing        Home Medications Prior to Admission medications   Medication Sig Start Date End Date Taking? Authorizing Provider  predniSONE (DELTASONE) 10 MG tablet 6, 5, 4, 3, 2 then 1 tablet by mouth daily for 6 days total. 12/02/22  Yes Calven Gilkes, Raynelle Fanning, PA-C  valACYclovir (VALTREX) 1000 MG tablet Take 1 tablet (1,000 mg total) by mouth 3 (three) times daily. 12/02/22  Yes Elford Evilsizer, Raynelle Fanning, PA-C  allopurinol (ZYLOPRIM) 300 MG tablet 1 tablet Orally Once a day for 30 day(s)    [provider]  amLODipine (NORVASC) 10 MG tablet Take 10 mg by mouth daily. Reported on 10/01/2015    [provider]  aspirin 81 MG chewable tablet Chew by mouth daily at 6 (six) AM.    [provider]  atorvastatin (LIPITOR)  40 MG tablet Take 40 mg by mouth daily. 07/12/15   [provider]  carvedilol (COREG) 3.125 MG tablet Take by mouth. 05/18/22 05/18/23  [provider]  cholecalciferol (VITAMIN D3) 25 MCG (1000 UNIT) tablet Take 1 tablet by mouth daily.    [provider]  cinacalcet (SENSIPAR) 30 MG tablet Take 30 mg by mouth daily. 10/14/21   [provider]  cyclobenzaprine (FLEXERIL) 10 MG tablet  12/20/21   [provider]  hydrALAZINE (APRESOLINE) 100 MG tablet Take 100 mg by mouth 2 (two) times daily. 10/14/21   [provider]  losartan (COZAAR) 50 MG tablet Take by mouth. 08/10/21 08/10/22  [provider]  naproxen (NAPROSYN) 500 MG tablet 1 tablet with food or milk as needed Orally every 12 hrs for 10 days 12/20/21   [provider]  omeprazole (PRILOSEC) 10 MG capsule Take 10 mg by mouth daily.    [provider]  traZODone (DESYREL) 150 MG tablet Take 150 mg by mouth at bedtime. Reported on 10/01/2015 06/30/15   [provider]      Allergies    Patient has no known allergies.    Review of Systems   Review of Systems  Constitutional:  Negative for chills and fever.  HENT:  Negative for hoarse voice and sore throat.   Respiratory:  Negative for shortness of breath and wheezing.   Gastrointestinal:  Negative for abdominal pain,  nausea and vomiting.  Skin:  Positive for rash.  Neurological:  Negative for numbness.  All other systems reviewed and are negative.   Physical Exam Updated Vital Signs BP (!) 152/90   Pulse 75   Temp 98.1 F (36.7 C) (Oral)   Resp 18   Wt 91.5 kg   SpO2 95%   BMI 36.31 kg/m  Physical Exam Constitutional:      General: She is not in acute distress.    Appearance: She is well-developed.  HENT:     Head: Normocephalic.  Cardiovascular:     Rate and Rhythm: Normal rate.  Pulmonary:     Effort: Pulmonary effort is normal.     Breath sounds: No wheezing.  Musculoskeletal:         General: Normal range of motion.     Cervical back: Neck supple.  Skin:    General: Skin is warm.     Findings: Rash present. No abscess, ecchymosis, erythema or petechiae. Rash is vesicular. Rash is not crusting, papular, pustular or urticarial.     ED Results / Procedures / Treatments   Labs (all labs ordered are listed, but only abnormal results are displayed) Labs Reviewed - No data to display  EKG None  Radiology No results found.  Procedures Procedures    Medications Ordered in ED Medications - No data to display  ED Course/ Medical Decision Making/ A&P                             Medical Decision Making Pt presenting with pruritic rash, periorbital and bilateral hands and wrists, no leg or truncal involvement.  No fevers, itchy, not painful.  More likely allergic/contact given it is nonpainful,  less likely infectious.  She is utd with vaccines,  had chicken pox as a child,  no contact with others with similar sx.  Exposed to 11year old grandchild who is sx free.  Nicotine patch was applied to right upper arm,  no rash at this site,  less likely this is the inciting event.  Pt will be covered with valtrex and prednisone to cover her for viral vs contact derm.  Referral to dermatology for f/u if sx persist or worsen.  Finish abx she is already on.   Risk Prescription drug management.           Final Clinical Impression(s) / ED Diagnoses Final diagnoses:  Rash and nonspecific skin eruption    Rx / DC Orders ED Discharge Orders          Ordered    valACYclovir (VALTREX) 1000 MG tablet  3 times daily        12/02/22 1056    predniSONE (DELTASONE) 10 MG tablet        12/02/22 1056              Burgess Amor, Cordelia Poche 12/02/22 1126    Eber Hong, MD 12/03/22 [phone]

## 2022-12-12 ENCOUNTER — Other Ambulatory Visit (HOSPITAL_COMMUNITY): Payer: Self-pay | Admitting: Family Medicine

## 2022-12-12 DIAGNOSIS — R229 Localized swelling, mass and lump, unspecified: Secondary | ICD-10-CM

## 2023-02-05 ENCOUNTER — Inpatient Hospital Stay: Payer: Medicare HMO

## 2023-02-05 ENCOUNTER — Ambulatory Visit (HOSPITAL_COMMUNITY): Admission: RE | Admit: 2023-02-05 | Payer: Medicare HMO | Source: Ambulatory Visit

## 2023-02-12 ENCOUNTER — Inpatient Hospital Stay: Payer: Medicare HMO | Admitting: Physician Assistant

## 2023-02-22 ENCOUNTER — Other Ambulatory Visit (HOSPITAL_COMMUNITY): Payer: Self-pay | Admitting: Family Medicine

## 2023-02-22 ENCOUNTER — Inpatient Hospital Stay: Payer: Medicare HMO | Attending: Hematology

## 2023-02-22 ENCOUNTER — Ambulatory Visit (HOSPITAL_COMMUNITY)
Admission: RE | Admit: 2023-02-22 | Discharge: 2023-02-22 | Disposition: A | Discharge status: Home / Self Care | Payer: Medicare HMO | Source: Ambulatory Visit | Attending: Physician Assistant | Admitting: Physician Assistant

## 2023-02-22 DIAGNOSIS — Z803 Family history of malignant neoplasm of breast: Secondary | ICD-10-CM | POA: Insufficient documentation

## 2023-02-22 DIAGNOSIS — R911 Solitary pulmonary nodule: Secondary | ICD-10-CM | POA: Insufficient documentation

## 2023-02-22 DIAGNOSIS — Z79899 Other long term (current) drug therapy: Secondary | ICD-10-CM | POA: Insufficient documentation

## 2023-02-22 DIAGNOSIS — Z87891 Personal history of nicotine dependence: Secondary | ICD-10-CM | POA: Insufficient documentation

## 2023-02-22 DIAGNOSIS — Z1231 Encounter for screening mammogram for malignant neoplasm of breast: Secondary | ICD-10-CM

## 2023-02-22 DIAGNOSIS — D508 Other iron deficiency anemias: Secondary | ICD-10-CM

## 2023-02-22 DIAGNOSIS — F17210 Nicotine dependence, cigarettes, uncomplicated: Secondary | ICD-10-CM | POA: Diagnosis not present

## 2023-02-22 DIAGNOSIS — R778 Other specified abnormalities of plasma proteins: Secondary | ICD-10-CM | POA: Diagnosis not present

## 2023-02-22 DIAGNOSIS — D500 Iron deficiency anemia secondary to blood loss (chronic): Secondary | ICD-10-CM

## 2023-02-22 DIAGNOSIS — C9000 Multiple myeloma not having achieved remission: Secondary | ICD-10-CM

## 2023-02-22 LAB — CBC WITH DIFFERENTIAL/PLATELET
Abs Immature Granulocytes: 0.05 10*3/uL (ref 0.00–0.07)
Basophils Absolute: 0 10*3/uL (ref 0.0–0.1)
Basophils Relative: 0 %
Eosinophils Absolute: 0.1 10*3/uL (ref 0.0–0.5)
Eosinophils Relative: 1 %
HCT: 46.3 % — ABNORMAL HIGH (ref 36.0–46.0)
Hemoglobin: 14.6 g/dL (ref 12.0–15.0)
Immature Granulocytes: 1 %
Lymphocytes Relative: 29 %
Lymphs Abs: 2.7 10*3/uL (ref 0.7–4.0)
MCH: 27.9 pg (ref 26.0–34.0)
MCHC: 31.5 g/dL (ref 30.0–36.0)
MCV: 88.4 fL (ref 80.0–100.0)
Monocytes Absolute: 0.4 10*3/uL (ref 0.1–1.0)
Monocytes Relative: 5 %
Neutro Abs: 6.1 10*3/uL (ref 1.7–7.7)
Neutrophils Relative %: 64 %
Platelets: 261 10*3/uL (ref 150–400)
RBC: 5.24 MIL/uL — ABNORMAL HIGH (ref 3.87–5.11)
RDW: 16.9 % — ABNORMAL HIGH (ref 11.5–15.5)
WBC: 9.4 10*3/uL (ref 4.0–10.5)
nRBC: 0 % (ref 0.0–0.2)

## 2023-02-22 LAB — COMPREHENSIVE METABOLIC PANEL
ALT: 18 U/L (ref 0–44)
AST: 18 U/L (ref 15–41)
Albumin: 3.5 g/dL (ref 3.5–5.0)
Alkaline Phosphatase: 98 U/L (ref 38–126)
Anion gap: 9 (ref 5–15)
BUN: 39 mg/dL — ABNORMAL HIGH (ref 8–23)
CO2: 24 mmol/L (ref 22–32)
Calcium: 9.7 mg/dL (ref 8.9–10.3)
Chloride: 103 mmol/L (ref 98–111)
Creatinine, Ser: 2.26 mg/dL — ABNORMAL HIGH (ref 0.44–1.00)
GFR, Estimated: 24 mL/min — ABNORMAL LOW (ref >60)
Glucose, Bld: 86 mg/dL (ref 70–99)
Potassium: 3.6 mmol/L (ref 3.5–5.1)
Sodium: 136 mmol/L (ref 135–145)
Total Bilirubin: 0.5 mg/dL (ref 0.3–1.2)
Total Protein: 6.7 g/dL (ref 6.5–8.1)

## 2023-02-22 LAB — LACTATE DEHYDROGENASE: LDH: 155 U/L (ref 98–192)

## 2023-02-23 LAB — KAPPA/LAMBDA LIGHT CHAINS
Kappa free light chain: 40.6 mg/L — ABNORMAL HIGH (ref 3.3–19.4)
Kappa, lambda light chain ratio: 1.3 (ref 0.26–1.65)
Lambda free light chains: 31.2 mg/L — ABNORMAL HIGH (ref 5.7–26.3)

## 2023-02-25 LAB — PROTEIN ELECTROPHORESIS, SERUM
A/G Ratio: 1.5 (ref 0.7–1.7)
Albumin ELP: 3.7 g/dL (ref 2.9–4.4)
Alpha-1-Globulin: 0.2 g/dL (ref 0.0–0.4)
Alpha-2-Globulin: 0.6 g/dL (ref 0.4–1.0)
Beta Globulin: 0.9 g/dL (ref 0.7–1.3)
Gamma Globulin: 0.8 g/dL (ref 0.4–1.8)
Globulin, Total: 2.5 g/dL (ref 2.2–3.9)
Total Protein ELP: 6.2 g/dL (ref 6.0–8.5)

## 2023-02-26 LAB — IMMUNOFIXATION ELECTROPHORESIS
IgA: 316 mg/dL (ref 87–352)
IgG (Immunoglobin G), Serum: 978 mg/dL (ref 586–1602)
IgM (Immunoglobulin M), Srm: 83 mg/dL (ref 26–217)
Total Protein ELP: 6.1 g/dL (ref 6.0–8.5)

## 2023-02-28 NOTE — Progress Notes (Unsigned)
[facility] [address] [place] [address]   CLINIC:  Medical Oncology/Hematology  PCP:  Georges Mouse, NP [address] [place] [address] [phone]   REASON FOR VISIT:  Follow-up for abnormal SPEP  PRIOR THERAPY: None  CURRENT THERAPY: Surveillance  INTERVAL HISTORY:   Natalie [name] 67 y.o. female returns for routine follow-up of abnormal SPEP.  She was last seen by Rojelio Brenner PA-C on 08/09/2022.  At today's visit, she reports feeling well.  No recent hospitalizations, surgeries, or changes in baseline health status.  She denies any new bone pain or recent fractures.   She denies any B symptoms such as fever, chills, night sweats, unintentional weight loss.   No new neurologic symptoms such as tinnitus, new-onset hearing loss, blurred vision, headache, or dizziness.  Denies any numbness or tingling in hands or feet.   No new masses or lymphadenopathy per her report.   She has 80% energy and 100% appetite. She endorses that she is maintaining a stable weight.   ASSESSMENT & PLAN:  1.  Abnormal SPEP: - Patient seen at the request of Dr. Wolfgang Phoenix as her SPEP showed a faint band. - Serum immunofixation was negative.  Repeat SPEP (06/16/2022) showed no evidence of monoclonal protein. - 24-hour urine/UPEP was negative. 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  Hgb 14.6, creatinine 2.26 (baseline CKD).  Calcium 9.7. - She does not report any new bone pains.  No tingling or numbness in extremities. - We have discussed the spectrum of plasma cell disorders.   - PLAN: No evidence of plasma cell disorder at this time.  We will repeat MGUS/myeloma panel in 12 months to ensure stability.   2.  Tobacco use - She is a current active  smoker, 5 cigarettes/day. However prior to that she smoked about 1 pack/day for 40+ years (since age 36) - She was agreeable to annual lung cancer screening CT scan following discussion of its impact on improving early detection and overall survival. - LDCT chest (07/26/2022): Lung RADS 3S, probably benign findings (new groundglass nodule of upper lobe measuring 11.6 mm and new solid pulmonary nodule of right upper lobe measuring 4.1 mm and solid pulmonary nodule measuring 3.9 mm seen in right lower lobe), with recommendation for short-term follow-up in 6 months - LDCT chest (02/22/2023): Pending radiology read as of 03/01/2023  - PLAN: Will follow results of CT chest obtained 02/22/2023, further plan TBD.  If no concerning abnormalities, we will plan on annual LDCT chest next due July 2025.   3.  Social/family history: -She lives with her husband at home.  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  She is independent of ADLs and IADLs including driving.  She retired working at [employer].  She also worked at then [employer].  She is a current active smoker, 5 cigarettes/day.  However prior to that she smoked about 1 pack/day for 25 to 30 years. - No family history of myeloma.  Sister had breast cancer.  Mother had breast cancer.  Father had brain tumor.  PLAN SUMMARY: >> Labs in 1 year = CBC/D, CMP, SPEP, immunofixation, light chains, LDH >> LDCT chest in 1 year (02/22/2024) >> OFFICE visit in 1 year (TWO weeks after labs/CT scan)  REVIEW OF SYSTEMS:   Review of Systems  Constitutional:  Negative for appetite change, chills, diaphoresis, fatigue, fever and unexpected weight change.  HENT:   Negative for lump/mass and nosebleeds.   Eyes:  Negative for eye problems.  Respiratory:  Negative for cough, hemoptysis and shortness of breath.   Cardiovascular:  Negative for chest pain, leg swelling and palpitations.  Gastrointestinal:  Negative for abdominal pain, blood in  stool, constipation, diarrhea, nausea and vomiting.  Genitourinary:  Negative for hematuria.   Skin: Negative.   Neurological:  Negative for dizziness, headaches and light-headedness.  Hematological:  Does not bruise/bleed easily.     PHYSICAL EXAM:  ECOG PERFORMANCE STATUS: 0 - Asymptomatic  There were no vitals filed for this visit. There were no vitals filed for this visit. Physical Exam Constitutional:      Appearance: Normal appearance. She is obese.  Cardiovascular:     Heart sounds: Normal heart sounds.  Pulmonary:     Breath sounds: Normal breath sounds.  Neurological:     General: No focal deficit present.     Mental Status: Mental status is at baseline.  Psychiatric:        Behavior: Behavior normal. Behavior is cooperative.     PAST MEDICAL/SURGICAL HISTORY:  Past Medical History:  Diagnosis Date   Cellulitis    Hypercholesterolemia    Hypertension    Sciatica    Past Surgical History:  Procedure Laterality Date   above the knee amputation     COLONOSCOPY WITH PROPOFOL N/A 10/01/2015   Procedure: COLONOSCOPY WITH PROPOFOL;  Surgeon: Christena Deem, MD;  Location: [facility] ENDOSCOPY;  Service: Endoscopy;  Laterality: N/A;   COLONOSCOPY WITH PROPOFOL N/A 06/13/2021   Procedure: COLONOSCOPY WITH PROPOFOL;  Surgeon: Lanelle Bal, DO;  Location: [facility] ENDO SUITE;  Service: Endoscopy;  Laterality: N/A;  10:00 / ASA III   POLYPECTOMY  06/13/2021   Procedure: POLYPECTOMY;  Surgeon: Lanelle Bal, DO;  Location: [facility] ENDO SUITE;  Service: Endoscopy;;    SOCIAL HISTORY:  Social History   Socioeconomic History   Marital status: Married    Spouse name: Not on file   Number of children: Not on file   Years of education: Not on file   Highest education level: Not on file  Occupational History   Not on file  Tobacco Use   Smoking status: Some Days    Types: Cigarettes   Smokeless tobacco: Never  Substance and Sexual Activity   Alcohol use: No   Drug use: No    Sexual activity: Not on file  Other Topics Concern   Not on file  Social History Narrative   Not on file   Social Determinants of Health   Financial Resource Strain: Not on file  Food Insecurity: Not on file  Transportation Needs: Not on file  Physical Activity: Not on file  Stress: Not on file  Social Connections: Unknown (12/18/2021)   [facility]   Social Network    Social Network: Not on file  Intimate Partner Violence: Unknown (11/09/2021)   Received from [facility]   HITS    Physically Hurt: Not on file    Insult or Talk Down To: Not on file    Threaten Physical Harm: Not on file    Scream or Curse: Not on file    FAMILY HISTORY:  No family history on file.  CURRENT MEDICATIONS:  Outpatient Encounter Medications as of 03/01/2023  Medication Sig  allopurinol (ZYLOPRIM) 300 MG tablet 1 tablet Orally Once a day for 30 day(s)   amLODipine (NORVASC) 10 MG tablet Take 10 mg by mouth daily. Reported on 10/01/2015   aspirin 81 MG chewable tablet Chew by mouth daily at 6 (six) AM.   atorvastatin (LIPITOR) 40 MG tablet Take 40 mg by mouth daily.   carvedilol (COREG) 3.125 MG tablet Take by mouth.   cholecalciferol (VITAMIN D3) 25 MCG (1000 UNIT) tablet Take 1 tablet by mouth daily.   cinacalcet (SENSIPAR) 30 MG tablet Take 30 mg by mouth daily.   cyclobenzaprine (FLEXERIL) 10 MG tablet    hydrALAZINE (APRESOLINE) 100 MG tablet Take 100 mg by mouth 2 (two) times daily.   losartan (COZAAR) 50 MG tablet Take by mouth.   naproxen (NAPROSYN) 500 MG tablet 1 tablet with food or milk as needed Orally every 12 hrs for 10 days   omeprazole (PRILOSEC) 10 MG capsule Take 10 mg by mouth daily.   predniSONE (DELTASONE) 10 MG tablet 6, 5, 4, 3, 2 then 1 tablet by mouth daily for 6 days total.   traZODone (DESYREL) 150 MG tablet Take 150 mg by mouth at bedtime. Reported on 10/01/2015   valACYclovir (VALTREX) 1000 MG tablet Take 1 tablet (1,000  mg total) by mouth 3 (three) times daily.   No facility-administered encounter medications on file as of 03/01/2023.    ALLERGIES:  No Known Allergies  LABORATORY DATA:  I have reviewed the labs as listed.  CBC    Component Value Date/Time   WBC 9.4 02/22/2023 0742   RBC 5.24 (H) 02/22/2023 0742   HGB 14.6 02/22/2023 0742   HGB 7.5 (L) 05/25/2014 0411   HCT 46.3 (H) 02/22/2023 0742   HCT 23.8 (L) 05/25/2014 0411   PLT 261 02/22/2023 0742   PLT 769 (H) 05/25/2014 0411   MCV 88.4 02/22/2023 0742   MCV 79 (L) 05/25/2014 0411   MCH 27.9 02/22/2023 0742   MCHC 31.5 02/22/2023 0742   RDW 16.9 (H) 02/22/2023 0742   RDW 18.3 (H) 05/25/2014 0411   LYMPHSABS 2.7 02/22/2023 0742   LYMPHSABS 2.9 05/25/2014 0411   MONOABS 0.4 02/22/2023 0742   MONOABS 0.9 05/25/2014 0411   EOSABS 0.1 02/22/2023 0742   EOSABS 0.1 05/25/2014 0411   BASOSABS 0.0 02/22/2023 0742   BASOSABS 0.1 05/25/2014 0411      Latest Ref Rng & Units 02/22/2023    7:42 AM 11/12/2021    3:53 PM 05/26/2021   10:20 AM  CMP  Glucose 70 - 99 mg/dL 86  315  98   BUN 8 - 23 mg/dL 39  25  20   Creatinine 0.44 - 1.00 mg/dL 1.76  1.60  7.37   Sodium 135 - 145 mmol/L 136  140  139   Potassium 3.5 - 5.1 mmol/L 3.6  3.6  4.3   Chloride 98 - 111 mmol/L 103  105  104   CO2 22 - 32 mmol/L 24  26  30    Calcium 8.9 - 10.3 mg/dL 9.7  9.9  9.6   Total Protein 6.5 - 8.1 g/dL 6.7  7.5    Total Bilirubin 0.3 - 1.2 mg/dL 0.5  0.6    Alkaline Phos 38 - 126 U/L 98  130    AST 15 - 41 U/L 18  14    ALT 0 - 44 U/L 18  10      DIAGNOSTIC IMAGING:  I have independently reviewed the relevant imaging and  discussed with the patient.   WRAP UP:  All questions were answered. The patient knows to call the clinic with any problems, questions or concerns.  Medical decision making: Low  Time spent on visit: I spent 15 minutes counseling the patient face to face. The total time spent in the appointment was 22 minutes and more than 50% was on  counseling.  Carnella Guadalajara, PA-C  03/01/23 9:42 AM

## 2023-03-01 ENCOUNTER — Inpatient Hospital Stay (HOSPITAL_BASED_OUTPATIENT_CLINIC_OR_DEPARTMENT_OTHER): Payer: Medicare HMO | Admitting: Physician Assistant

## 2023-03-01 VITALS — BP 110/66 | HR 80 | Temp 97.4°F | Resp 18 | Wt 202.8 lb

## 2023-03-01 DIAGNOSIS — R778 Other specified abnormalities of plasma proteins: Secondary | ICD-10-CM | POA: Diagnosis not present

## 2023-03-01 DIAGNOSIS — Z87891 Personal history of nicotine dependence: Secondary | ICD-10-CM

## 2023-03-01 DIAGNOSIS — R911 Solitary pulmonary nodule: Secondary | ICD-10-CM | POA: Diagnosis not present

## 2023-03-01 NOTE — Patient Instructions (Signed)
[facility] at [facility] **VISIT SUMMARY & IMPORTANT INSTRUCTIONS **   You were seen today by Rojelio Brenner PA-C for your follow-up visit.    ABNORMAL PROTEIN: You were initially sent to [place] by your kidney doctor due to an abnormal protein in your blood that can sometimes be precancerous (MGUS) and lead to a type of cancer called multiple myeloma.  When we rechecked your labs, we did NOT see any sign of this abnormal protein.  We will check these labs again in 1 year to make sure everything looks stable.  CT SCAN OF THE CHEST: The CT scan of your chest from December 2023 showed some pulmonary nodules (spots on your lungs), which were most likely benign.  There were also findings consistent with COPD and plaque buildup in the arteries around your heart (places you at risk for heart disease or heart attack in the future).  You should discuss these findings with your primary care doctor. We checked a repeat CT chest in July 2024 to make sure that the nodules in your lungs have not grown.  We are still waiting on the official report from this test.  You should receive a call within the next 1 to 2 weeks to explain these results.  FOLLOW-UP APPOINTMENT: Office visit in 1 year (1 week after labs and CT scan)  ** Thank you for trusting me with your healthcare!  I strive to provide all of my patients with quality care at each visit.  If you receive a survey for this visit, I would be so grateful to you for taking the time to provide feedback.  Thank you in advance!  ~ Samaj Wessells                   Dr. Doreatha Massed   &   Rojelio Brenner, PA-C   - - - - - - - - - - - - - - - - - -    Thank you for choosing [facility] at [facility] to provide your oncology and hematology care.  To afford each patient quality time with our provider, please arrive at least 15 minutes before your scheduled appointment time.   If you have a lab appointment with the  Cancer Center please come in thru the Main Entrance and check in at the main information desk.  You need to re-schedule your appointment should you arrive 10 or more minutes late.  We strive to give you quality time with our providers, and arriving late affects you and other patients whose appointments are after yours.  Also, if you no show three or more times for appointments you may be dismissed from the clinic at the providers discretion.     Again, thank you for choosing [facility].  Our hope is that these requests will decrease the amount of time that you wait before being seen by our physicians.       _____________________________________________________________  Should you have questions after your visit to [facility], please contact our office at [phone] and follow the prompts.  Our office hours are 8:00 a.m. and 4:30 p.m. Monday - Friday.  Please note that voicemails left after 4:00 p.m. may not be returned until the following business day.  We are closed weekends and major holidays.  You do have access to a nurse 24-7, just call the main number to the clinic [phone] and do not press any options,  hold on the line and a nurse will answer the phone.    For prescription refill requests, have your pharmacy contact our office and allow 72 hours.

## 2023-03-05 ENCOUNTER — Telehealth: Payer: Self-pay | Admitting: *Deleted

## 2023-03-05 NOTE — Telephone Encounter (Signed)
Received call from [facility] Radiology with abnormal CT lung screening.  Rojelio Brenner, [facility] aware or results.

## 2023-03-07 NOTE — Progress Notes (Signed)
Patient notified of LDCT Lung Cancer Screening Results via mail with the recommendation to follow-up in 12 months. Patient's referring provider has been sent a copy of results. Results are as follows:  IMPRESSION: 1. Lung-RADS 2-S, benign appearance or behavior. Continue annual screening with low-dose chest CT without contrast in 12 months. 2. The "S" modifier above refers to potentially clinically significant non lung cancer related findings. Specifically, new complete right middle lobe atelectasis with patchy material partially occluding the right middle lobe airways, favoring mucous plugging. Suggest attention on follow-up chest CT with IV contrast in 3 months to assess for resolution. 3. Two-vessel coronary atherosclerosis. 4. Aortic Atherosclerosis (ICD10-I70.0) and Emphysema (ICD10-J43.9).  Patient also made aware via telephone of referral to pulmonology per Rojelio Brenner, PA-C.

## 2023-03-31 NOTE — Progress Notes (Unsigned)
Natalie [name], female    DOB: 05-14-1956    MRN: [id number]   Brief patient profile:  89  yobf  Active smoker  referred to pulmonary clinic in [place]  04/02/2023 by Bernell List NP thru DR Ellin Saba (seeing for abn  SPEP)   for abnormal LDSCT ? RML syndrome      History of Present Illness  04/02/2023  Pulmonary/ 1st office eval/ Camara Renstrom / [place] Office  Chief Complaint  Patient presents with   Establish Care  Dyspnea:  L AKA wears prosthesis / doesn't go out much / able to housework and cook for herself  Cough: each am coughs up chunk of white / no plugs / no blood  Sleep: bed is flat/ level with 2 pillows  SABA use: twice a week at week only p activity  02: none  Lung cancer screen: last done 02/2023    No obvious day to day or daytime pattern/variability or assoc excess/ purulent sputum or mucus plugs or hemoptysis or cp or chest tightness, subjective wheeze or overt sinus or hb symptoms.    Also denies any obvious fluctuation of symptoms with weather or environmental changes or other aggravating or alleviating factors except as outlined above   No unusual exposure hx or h/o childhood pna/ asthma or knowledge of premature birth.  Current Allergies, Complete Past Medical History, Past Surgical History, Family History, and Social History were reviewed in [employer] record.  ROS  The following are not active complaints unless bolded Hoarseness, sore throat, dysphagia, dental problems, itching, sneezing,  nasal congestion or discharge of excess mucus or purulent secretions, ear ache,   fever, chills, sweats, unintended wt loss or wt gain, classically pleuritic or exertional cp,  orthopnea pnd or arm/hand swelling  or leg swelling, presyncope, palpitations, abdominal pain, anorexia, nausea, vomiting, diarrhea  or change in bowel habits or change in bladder habits, change in stools or change in urine, dysuria, hematuria,  rash, arthralgias, visual  complaints, headache, numbness, weakness or ataxia or problems with walking or coordination,  change in mood or  memory.              Outpatient Medications Prior to Visit  Medication Sig Dispense Refill   albuterol (VENTOLIN HFA) 108 (90 Base) MCG/ACT inhaler SMARTSIG:1 Puff(s) Via Inhaler Every 4 Hours PRN     allopurinol (ZYLOPRIM) 300 MG tablet 1 tablet Orally Once a day for 30 day(s)     amLODipine (NORVASC) 10 MG tablet Take 10 mg by mouth daily. Reported on 10/01/2015     aspirin 81 MG chewable tablet Chew by mouth daily at 6 (six) AM.     atorvastatin (LIPITOR) 40 MG tablet Take 40 mg by mouth daily.     carvedilol (COREG) 3.125 MG tablet Take by mouth.     cholecalciferol (VITAMIN D3) 25 MCG (1000 UNIT) tablet Take 1 tablet by mouth daily.     cinacalcet (SENSIPAR) 30 MG tablet Take 30 mg by mouth daily.     cyclobenzaprine (FLEXERIL) 10 MG tablet      doxycycline (VIBRAMYCIN) 100 MG capsule Take 100 mg by mouth 2 (two) times daily.     hydrALAZINE (APRESOLINE) 100 MG tablet Take 100 mg by mouth 2 (two) times daily.     naproxen (NAPROSYN) 500 MG tablet 1 tablet with food or milk as needed Orally every 12 hrs for 10 days     omeprazole (PRILOSEC) 10 MG capsule Take 10 mg by mouth daily.  traZODone (DESYREL) 150 MG tablet Take 150 mg by mouth at bedtime. Reported on 10/01/2015     valACYclovir (VALTREX) 1000 MG tablet Take 1 tablet (1,000 mg total) by mouth 3 (three) times daily. 21 tablet 0   losartan (COZAAR) 50 MG tablet Take by mouth.     cephALEXin (KEFLEX) 500 MG capsule 1 capsule Orally twice a day for 10 days     predniSONE (STERAPRED UNI-PAK 21 TAB) 5 MG (21) TBPK tablet tapering dose pack Orally every day for 6 days     No facility-administered medications prior to visit.    Past Medical History:  Diagnosis Date   Cellulitis    Hypercholesterolemia    Hypertension    Sciatica       Objective:     BP 123/78   Pulse 94   Ht 5' 2.5" (1.588 m)   Wt 202 lb  (91.6 kg)   SpO2 91%   BMI 36.36 kg/m   SpO2: 91 % pleasant amb bf walks with crutch.   HEENT : Oropharynx  clear/ full dentures  Nasal turbinates nl    NECK :  without  apparent JVD/ palpable Nodes/TM    LUNGS: no acc muscle use,  Min barrel  contour chest wall with bilateral  slightly decreased bs s audible wheeze and  without cough on insp or exp maneuvers and min  Hyperresonant  to  percussion bilaterally    CV:  RRR  no s3 or murmur or increase in P2, and no edema   ABD:  soft and nontender    MS:  ext warm with L AKA prosthesis Or obvious joint restrictions  calf tenderness, cyanosis or clubbing     SKIN: warm and dry with hyperpigmented splotchy rash both forearms  NEURO:  alert, approp, nl sensorium with  no motor or cerebellar deficits apparent.            I personally reviewed images and agree with radiology impression as follows:   Chest LDSCT     02/22/23  1. Lung-RADS 2-S, benign appearance or behavior. Continue annual screening with low-dose chest CT without contrast in 12 months. 2.   New complete right middle lobe atelectasis with patchy material partially occluding the right middle lobe airways, favoring mucous plugging. Suggest attention on follow-up chest CT with IV contrast in 3 months to assess for resolution. 3. Two-vessel coronary atherosclerosis. 4. Aortic Atherosclerosis (ICD10-I70.0) and Emphysema ( mild centrilobular)    Assessment   Right middle lobe syndrome Active smoker/ newly noted 02/22/23 on LDSCT  vs  prior 07/26/22 LDSCT  > rads rec repeat ct with contrast 05/24/22 but GFR < 30  - rx max mucinex  04/02/2023 >>>  - CT chest s contrast 05/25/23 >>>  Most likely this is just mucus plugging in smoker with clinically mild but persistent CB due to mucociliary dysfunction   Can't do ct with contrast due to CRI so best option is rx as above and f/u in pulmonary prn - if still has RML ATX needs FOB in [place], explained in detail to pt    Discussed in detail all the  indications, usual  risks and alternatives  relative to the benefits with patient who agrees to proceed with w/u as outlined.        Cigarette smoker Counseled re importance of smoking cessation but did not meet time criteria for separate billing           Each maintenance medication was reviewed in detail  including emphasizing most importantly the difference between maintenance and prns and under what circumstances the prns are to be triggered using an action plan format where appropriate.  Total time for H and P, chart review, counseling, reviewing hfa device(s) and generating customized AVS unique to this office visit / same day charting = 45 min new pt eval        Sandrea Hughs, MD Pulmonary and Critical Care Medicine [facility]

## 2023-04-02 ENCOUNTER — Encounter: Payer: Self-pay | Admitting: Internal Medicine

## 2023-04-02 ENCOUNTER — Ambulatory Visit: Payer: Medicare HMO | Admitting: Internal Medicine

## 2023-04-02 VITALS — BP 123/78 | HR 94 | Ht 62.5 in | Wt 202.0 lb

## 2023-04-02 DIAGNOSIS — M1990 Unspecified osteoarthritis, unspecified site: Secondary | ICD-10-CM | POA: Insufficient documentation

## 2023-04-02 DIAGNOSIS — J9819 Other pulmonary collapse: Secondary | ICD-10-CM | POA: Diagnosis not present

## 2023-04-02 DIAGNOSIS — F17210 Nicotine dependence, cigarettes, uncomplicated: Secondary | ICD-10-CM | POA: Diagnosis not present

## 2023-04-02 NOTE — Assessment & Plan Note (Addendum)
Active smoker/ newly noted 02/22/23 on LDSCT  vs  prior 07/26/22 LDSCT  > rads rec repeat ct with contrast 05/24/22 but GFR < 30  - rx max mucinex  04/02/2023 >>>  - CT chest s contrast 05/25/23 >>>  Most likely this is just mucus plugging in smoker with clinically mild but persistent CB due to mucociliary dysfunction   Can't do ct with contrast due to CRI so best option is rx as above and f/u in pulmonary prn - if still has RML ATX needs FOB in [place], explained in detail to pt   Discussed in detail all the  indications, usual  risks and alternatives  relative to the benefits with patient who agrees to proceed with w/u as outlined.

## 2023-04-02 NOTE — Patient Instructions (Addendum)
Mucinex 1200 mg twice daily until the next scan   Also  Ok to try albuterol x 2 puffs  x 15 min before an activity (on alternating days)  that you know would usually make you short of breath and see if it makes any difference and if makes none then don't take albuterol after activity unless you can't catch your breath as this means it's the resting that helps, not the albuterol.  Work on inhaler technique:  relax and gently blow all the way out then take a nice smooth full deep breath back in, triggering the inhaler at same time you start breathing in.  Hold breath in for at least  5 seconds if you can.  .       My office will be contacting you by phone for referral to repeat CT chest without contrast 05/25/23   - if you don't hear back from my office within one week please call us back or notify us thru MyChart and we'll address it right away.   The key is to stop smoking completely before smoking completely stops you!  Pulmonary follow up is as needed - bring inhaler with you

## 2023-04-02 NOTE — Assessment & Plan Note (Signed)
Counseled re importance of smoking cessation but did not meet time criteria for separate billing    Each maintenance medication was reviewed in detail including emphasizing most importantly the difference between maintenance and prns and under what circumstances the prns are to be triggered using an action plan format where appropriate.  Total time for H and P, chart review, counseling, reviewing hfa device(s) and generating customized AVS unique to this office visit / same day charting = 45 min new pt eval       

## 2023-05-25 ENCOUNTER — Ambulatory Visit (HOSPITAL_COMMUNITY): Admission: RE | Admit: 2023-05-25 | Payer: Medicare HMO | Source: Ambulatory Visit

## 2024-01-17 ENCOUNTER — Other Ambulatory Visit (HOSPITAL_COMMUNITY): Payer: Self-pay | Admitting: Family Medicine

## 2024-01-17 ENCOUNTER — Encounter (HOSPITAL_COMMUNITY): Payer: Self-pay | Admitting: Family Medicine

## 2024-01-17 DIAGNOSIS — M79604 Pain in right leg: Secondary | ICD-10-CM

## 2024-01-17 DIAGNOSIS — R609 Edema, unspecified: Secondary | ICD-10-CM

## 2024-02-22 ENCOUNTER — Ambulatory Visit (HOSPITAL_COMMUNITY): Admission: RE | Admit: 2024-02-22 | Payer: Medicare HMO | Source: Ambulatory Visit

## 2024-02-22 ENCOUNTER — Inpatient Hospital Stay: Payer: Medicare HMO | Attending: Family

## 2024-03-03 ENCOUNTER — Ambulatory Visit: Payer: Medicare HMO | Admitting: Physician Assistant

## 2024-03-05 NOTE — Progress Notes (Deleted)
 [facility] [address], [place] [address]   CLINIC:  Medical Oncology/Hematology  PCP:  Joshua Bari HERO, NP [address] [place] [address] [phone]   REASON FOR VISIT:  Follow-up for abnormal SPEP  PRIOR THERAPY: None  CURRENT THERAPY: Surveillance  INTERVAL HISTORY:   Natalie [name] 68 y.o. female returns for routine follow-up of abnormal SPEP.  She was last seen by Pleasant Barefoot PA-C on 03/01/2023.  At today's visit, she reports feeling ***.  No recent hospitalizations, surgeries, or changes in baseline health status. ***She denies any new bone pain or recent fractures.    ***She denies any B symptoms such as fever, chills, night sweats, unintentional weight loss.   ***No new neurologic symptoms such as tinnitus, new-onset hearing loss, blurred vision, headache, or dizziness. ***Denies any numbness or tingling in hands or feet.    ***No new masses or lymphadenopathy per her report.    ***She has 80***% energy and 100***% appetite.  ***She endorses that she is maintaining a stable weight.  ASSESSMENT & PLAN:  1.  Abnormal SPEP: - Patient seen at the request of Dr. Rachele as her SPEP showed a faint band. - Serum immunofixation was negative.  Repeat SPEP (06/16/2022) showed no evidence of monoclonal protein. - 24-hour urine/UPEP was negative. 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  Hgb 14.6, creatinine 2.26 (baseline CKD).  Calcium 9.7. - She does not report any new bone pains.  No tingling or numbness in extremities. - We have discussed the spectrum of plasma cell disorders.   - PLAN: No evidence of plasma cell disorder at this time.  We will repeat MGUS/myeloma panel in 12 months to ensure stability.   2.  Tobacco  use - She is a current active smoker, 5 cigarettes/day. However prior to that she smoked about 1 pack/day for 40+ years (since age 79) - She was agreeable to annual lung cancer screening CT scan following discussion of its impact on improving early detection and overall survival. - LDCT chest (07/26/2022): Lung RADS 3S, probably benign findings (new groundglass nodule of upper lobe measuring 11.6 mm and new solid pulmonary nodule of right upper lobe measuring 4.1 mm and solid pulmonary nodule measuring 3.9 mm seen in right lower lobe), with recommendation for short-term follow-up in 6 months - LDCT chest (02/22/2023): Pending radiology read as of 03/01/2023  - PLAN: Will follow results of CT chest obtained 02/22/2023, further plan TBD.  If no concerning abnormalities, we will plan on annual LDCT chest next due July 2025.   3.  Social/family history: -She lives with her husband at home.  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  She is independent of ADLs and IADLs including driving.  She retired working at [employer].  She also worked at then [employer].  She is a current active smoker, 5 cigarettes/day.  However prior to that she smoked about 1 pack/day for 25 to 30 years. - No family history of myeloma.  Sister had breast cancer.  Mother had breast cancer.  Father had brain tumor.  PLAN SUMMARY: >> Labs in 1 year = CBC/D, CMP, SPEP, immunofixation, light chains, LDH >> LDCT chest in 1 year (02/22/2024) >> OFFICE visit in 1 year (TWO weeks after labs/CT scan)  REVIEW OF SYSTEMS:   Review of Systems  Constitutional:  Negative for appetite change, chills, diaphoresis, fatigue, fever and unexpected weight change.  HENT:   Negative for lump/mass and nosebleeds.   Eyes:  Negative for eye problems.  Respiratory:  Negative for cough, hemoptysis and shortness of breath.   Cardiovascular:  Negative for chest pain, leg swelling and palpitations.  Gastrointestinal:  Negative  for abdominal pain, blood in stool, constipation, diarrhea, nausea and vomiting.  Genitourinary:  Negative for hematuria.   Skin: Negative.   Neurological:  Negative for dizziness, headaches and light-headedness.  Hematological:  Does not bruise/bleed easily.     PHYSICAL EXAM:  ECOG PERFORMANCE STATUS: 0 - Asymptomatic  There were no vitals filed for this visit. There were no vitals filed for this visit. Physical Exam Constitutional:      Appearance: Normal appearance. She is obese.  Cardiovascular:     Heart sounds: Normal heart sounds.  Pulmonary:     Breath sounds: Normal breath sounds.  Neurological:     General: No focal deficit present.     Mental Status: Mental status is at baseline.  Psychiatric:        Behavior: Behavior normal. Behavior is cooperative.     PAST MEDICAL/SURGICAL HISTORY:  Past Medical History:  Diagnosis Date   Cellulitis    Hypercholesterolemia    Hypertension    Sciatica    Past Surgical History:  Procedure Laterality Date   above the knee amputation     COLONOSCOPY WITH PROPOFOL  N/A 10/01/2015   Procedure: COLONOSCOPY WITH PROPOFOL ;  Surgeon: Gladis RAYMOND Mariner, MD;  [facility];  Service: Endoscopy;  Laterality: N/A;   COLONOSCOPY WITH PROPOFOL  N/A 06/13/2021   Procedure: COLONOSCOPY WITH PROPOFOL ;  Surgeon: Cindie Carlin POUR, DO;  Location: [facility] ENDO SUITE;  Service: Endoscopy;  Laterality: N/A;  10:00 / ASA III   POLYPECTOMY  06/13/2021   Procedure: POLYPECTOMY;  Surgeon: Cindie Carlin POUR, DO;  Location: [facility] ENDO SUITE;  Service: Endoscopy;;    SOCIAL HISTORY:  Social History   Socioeconomic History   Marital status: Married    Spouse name: Not on file   Number of children: Not on file   Years of education: Not on file   Highest education level: Not on file  Occupational History   Not on file  Tobacco Use   Smoking status: Some Days    Types: Cigarettes   Smokeless tobacco: Never  Substance and Sexual Activity    Alcohol use: No   Drug use: No   Sexual activity: Not on file  Other Topics Concern   Not on file  Social History Narrative   Not on file   Social Drivers of Health   Financial Resource Strain: Low Risk  (01/26/2024)   Received from [facility]   Overall Financial Resource Strain (CARDIA)    Difficulty of Paying Living Expenses: Not hard at all  Food Insecurity: No Food Insecurity (01/26/2024)   Received from [facility]   Hunger Vital Sign    Within the past 12 months, you worried that your food would run out before you got the money to buy more.: Never true    Within the past 12 months, the food you bought just didn't last and you didn't have money to get more.: Never true  Transportation Needs: No Transportation Needs (01/26/2024)   Received from [facility] - [facility]    Lack of Transportation (Medical): No    Lack of  Transportation (Non-Medical): No  Physical Activity: Inactive (01/26/2024)   Received from [facility]   Exercise Vital Sign    On average, how many days per week do you engage in moderate to strenuous exercise (like a brisk walk)?: 0 days    On average, how many minutes do you engage in exercise at this level?: 0 min  Stress: Patient Unable To Answer (01/26/2024)   Received from [facility] - Occupational Stress Questionnaire    Feeling of Stress : Patient unable to answer  Social Connections: Unknown (01/26/2024)   Received from [facility]   Social Connection and Isolation Panel    In a typical week, how many times do you talk on the phone with family, friends, or neighbors?: More than three times a week    How often do you get together with friends or relatives?: More than three times a week    How often do you attend church or religious services?: Patient unable to answer    Do you belong to any clubs or organizations such as church groups, unions, fraternal or athletic groups,  or school groups?: Patient unable to answer    How often do you attend meetings of the clubs or organizations you belong to?: Patient unable to answer    Are you married, widowed, divorced, separated, never married, or living with a partner?: Married  Intimate Partner Violence: Not At Risk (01/26/2024)   Received from [facility]   Humiliation, Afraid, Rape, and Kick questionnaire    Within the last year, have you been afraid of your partner or ex-partner?: No    Within the last year, have you been humiliated or emotionally abused in other ways by your partner or ex-partner?: No    Within the last year, have you been kicked, hit, slapped, or otherwise physically hurt by your partner or ex-partner?: No    Within the last year, have you been raped or forced to have any kind of sexual activity by your partner or ex-partner?: No    FAMILY HISTORY:  No family history on file.  CURRENT MEDICATIONS:  Outpatient Encounter Medications as of 03/10/2024  Medication Sig   albuterol (VENTOLIN HFA) 108 (90 Base) MCG/ACT inhaler SMARTSIG:1 Puff(s) Via Inhaler Every 4 Hours PRN   allopurinol (ZYLOPRIM) 300 MG tablet 1 tablet Orally Once a day for 30 day(s)   amLODipine (NORVASC) 10 MG tablet Take 10 mg by mouth daily. Reported on 10/01/2015   aspirin 81 MG chewable tablet Chew by mouth daily at 6 (six) AM.   atorvastatin (LIPITOR) 40 MG tablet Take 40 mg by mouth daily.   carvedilol (COREG) 3.125 MG tablet Take by mouth.   cholecalciferol (VITAMIN D3) 25 MCG (1000 UNIT) tablet Take 1 tablet by mouth daily.   cinacalcet (SENSIPAR) 30 MG tablet Take 30 mg by mouth daily.   cyclobenzaprine (FLEXERIL) 10 MG tablet    doxycycline  (VIBRAMYCIN ) 100 MG capsule Take 100 mg by mouth 2 (two) times daily.   hydrALAZINE (APRESOLINE) 100 MG tablet Take 100 mg by mouth 2 (two) times daily.   losartan (COZAAR) 50 MG tablet Take by mouth.   naproxen (NAPROSYN) 500 MG tablet 1 tablet with food or milk as needed Orally  every 12 hrs for 10 days   omeprazole (PRILOSEC) 10 MG capsule Take 10 mg by mouth daily.   traZODone (DESYREL) 150 MG tablet Take 150 mg by mouth at bedtime. Reported on 10/01/2015   valACYclovir  (  VALTREX ) 1000 MG tablet Take 1 tablet (1,000 mg total) by mouth 3 (three) times daily.   No facility-administered encounter medications on file as of 03/10/2024.    ALLERGIES:  No Known Allergies  LABORATORY DATA:  I have reviewed the labs as listed.  CBC    Component Value Date/Time   WBC 9.4 02/22/2023 0742   RBC 5.24 (H) 02/22/2023 0742   HGB 14.6 02/22/2023 0742   HGB 7.5 (L) 05/25/2014 0411   HCT 46.3 (H) 02/22/2023 0742   HCT 23.8 (L) 05/25/2014 0411   PLT 261 02/22/2023 0742   PLT 769 (H) 05/25/2014 0411   MCV 88.4 02/22/2023 0742   MCV 79 (L) 05/25/2014 0411   MCH 27.9 02/22/2023 0742   MCHC 31.5 02/22/2023 0742   RDW 16.9 (H) 02/22/2023 0742   RDW 18.3 (H) 05/25/2014 0411   LYMPHSABS 2.7 02/22/2023 0742   LYMPHSABS 2.9 05/25/2014 0411   MONOABS 0.4 02/22/2023 0742   MONOABS 0.9 05/25/2014 0411   EOSABS 0.1 02/22/2023 0742   EOSABS 0.1 05/25/2014 0411   BASOSABS 0.0 02/22/2023 0742   BASOSABS 0.1 05/25/2014 0411      Latest Ref Rng & Units 02/22/2023    7:42 AM 11/12/2021    3:53 PM 05/26/2021   10:20 AM  CMP  Glucose 70 - 99 mg/dL 86  892  98   BUN 8 - 23 mg/dL 39  25  20   Creatinine 0.44 - 1.00 mg/dL 7.73  7.79  8.09   Sodium 135 - 145 mmol/L 136  140  139   Potassium 3.5 - 5.1 mmol/L 3.6  3.6  4.3   Chloride 98 - 111 mmol/L 103  105  104   CO2 22 - 32 mmol/L 24  26  30    Calcium 8.9 - 10.3 mg/dL 9.7  9.9  9.6   Total Protein 6.5 - 8.1 g/dL 6.7  7.5    Total Bilirubin 0.3 - 1.2 mg/dL 0.5  0.6    Alkaline Phos 38 - 126 U/L 98  130    AST 15 - 41 U/L 18  14    ALT 0 - 44 U/L 18  10      DIAGNOSTIC IMAGING:  I have independently reviewed the relevant imaging and discussed with the patient.   WRAP UP:  All questions were answered. The patient knows to call  the clinic with any problems, questions or concerns.  Medical decision making: Low  Time spent on visit: I spent 15 minutes counseling the patient face to face. The total time spent in the appointment was 22 minutes and more than 50% was on counseling.  Pleasant CHRISTELLA Barefoot, PA-C  03/05/24 2:43 PM

## 2024-03-07 NOTE — Progress Notes (Signed)
 Attempted to reach patient regarding follow-up LDCT. Unable to reach patient directly. Unable to leave VM.

## 2024-03-10 ENCOUNTER — Inpatient Hospital Stay: Payer: Medicare HMO | Admitting: Physician Assistant

## 2024-05-08 ENCOUNTER — Encounter (INDEPENDENT_AMBULATORY_CARE_PROVIDER_SITE_OTHER): Payer: Self-pay | Admitting: *Deleted

## 2024-07-07 ENCOUNTER — Other Ambulatory Visit: Payer: Self-pay

## 2024-07-07 DIAGNOSIS — Z87891 Personal history of nicotine dependence: Secondary | ICD-10-CM

## 2024-07-07 DIAGNOSIS — Z122 Encounter for screening for malignant neoplasm of respiratory organs: Secondary | ICD-10-CM

## 2024-07-24 ENCOUNTER — Other Ambulatory Visit (HOSPITAL_COMMUNITY): Payer: Self-pay | Admitting: Family Medicine

## 2024-07-24 DIAGNOSIS — M810 Age-related osteoporosis without current pathological fracture: Secondary | ICD-10-CM

## 2024-08-21 ENCOUNTER — Other Ambulatory Visit: Payer: Self-pay

## 2024-09-10 ENCOUNTER — Ambulatory Visit (HOSPITAL_COMMUNITY): Admission: RE | Admit: 2024-09-10 | Source: Ambulatory Visit
